# Patient Record
Sex: Male | Born: 1935 | Hispanic: Yes | Smoking: Former smoker
Health system: Southern US, Community
[De-identification: ages and names within clinical notes are randomized; demographics above are authoritative.]

---

## 2018-11-26 ENCOUNTER — Inpatient Hospital Stay (HOSPITAL_COMMUNITY)
Admission: EM | Admit: 2018-11-26 | Discharge: 2018-12-21 | DRG: 177 | Disposition: E | Payer: Medicaid Other | Attending: Internal Medicine | Admitting: Internal Medicine

## 2018-11-26 ENCOUNTER — Encounter (HOSPITAL_COMMUNITY): Payer: Self-pay

## 2018-11-26 ENCOUNTER — Emergency Department (HOSPITAL_COMMUNITY): Payer: Medicaid Other

## 2018-11-26 ENCOUNTER — Inpatient Hospital Stay (HOSPITAL_COMMUNITY): Payer: Medicaid Other

## 2018-11-26 ENCOUNTER — Other Ambulatory Visit: Payer: Self-pay

## 2018-11-26 DIAGNOSIS — I639 Cerebral infarction, unspecified: Secondary | ICD-10-CM | POA: Diagnosis not present

## 2018-11-26 DIAGNOSIS — G9341 Metabolic encephalopathy: Secondary | ICD-10-CM | POA: Diagnosis not present

## 2018-11-26 DIAGNOSIS — R740 Nonspecific elevation of levels of transaminase and lactic acid dehydrogenase [LDH]: Secondary | ICD-10-CM

## 2018-11-26 DIAGNOSIS — Z515 Encounter for palliative care: Secondary | ICD-10-CM | POA: Diagnosis not present

## 2018-11-26 DIAGNOSIS — R4182 Altered mental status, unspecified: Secondary | ICD-10-CM

## 2018-11-26 DIAGNOSIS — D696 Thrombocytopenia, unspecified: Secondary | ICD-10-CM | POA: Diagnosis present

## 2018-11-26 DIAGNOSIS — U071 COVID-19: Principal | ICD-10-CM | POA: Diagnosis present

## 2018-11-26 DIAGNOSIS — Z87891 Personal history of nicotine dependence: Secondary | ICD-10-CM | POA: Diagnosis not present

## 2018-11-26 DIAGNOSIS — I6381 Other cerebral infarction due to occlusion or stenosis of small artery: Secondary | ICD-10-CM | POA: Diagnosis not present

## 2018-11-26 DIAGNOSIS — R402142 Coma scale, eyes open, spontaneous, at arrival to emergency department: Secondary | ICD-10-CM | POA: Diagnosis not present

## 2018-11-26 DIAGNOSIS — J690 Pneumonitis due to inhalation of food and vomit: Secondary | ICD-10-CM | POA: Diagnosis not present

## 2018-11-26 DIAGNOSIS — R0602 Shortness of breath: Secondary | ICD-10-CM

## 2018-11-26 DIAGNOSIS — G9340 Encephalopathy, unspecified: Secondary | ICD-10-CM

## 2018-11-26 DIAGNOSIS — F0390 Unspecified dementia without behavioral disturbance: Secondary | ICD-10-CM | POA: Diagnosis present

## 2018-11-26 DIAGNOSIS — Z66 Do not resuscitate: Secondary | ICD-10-CM | POA: Diagnosis present

## 2018-11-26 DIAGNOSIS — J1282 Pneumonia due to coronavirus disease 2019: Secondary | ICD-10-CM | POA: Diagnosis present

## 2018-11-26 DIAGNOSIS — R402352 Coma scale, best motor response, localizes pain, at arrival to emergency department: Secondary | ICD-10-CM | POA: Diagnosis present

## 2018-11-26 DIAGNOSIS — G8191 Hemiplegia, unspecified affecting right dominant side: Secondary | ICD-10-CM | POA: Diagnosis not present

## 2018-11-26 DIAGNOSIS — R1310 Dysphagia, unspecified: Secondary | ICD-10-CM | POA: Diagnosis not present

## 2018-11-26 DIAGNOSIS — R402222 Coma scale, best verbal response, incomprehensible words, at arrival to emergency department: Secondary | ICD-10-CM | POA: Diagnosis present

## 2018-11-26 DIAGNOSIS — R7401 Elevation of levels of liver transaminase levels: Secondary | ICD-10-CM | POA: Diagnosis present

## 2018-11-26 DIAGNOSIS — R32 Unspecified urinary incontinence: Secondary | ICD-10-CM | POA: Diagnosis present

## 2018-11-26 DIAGNOSIS — Z79899 Other long term (current) drug therapy: Secondary | ICD-10-CM

## 2018-11-26 DIAGNOSIS — D509 Iron deficiency anemia, unspecified: Secondary | ICD-10-CM | POA: Diagnosis not present

## 2018-11-26 DIAGNOSIS — N179 Acute kidney failure, unspecified: Secondary | ICD-10-CM | POA: Diagnosis present

## 2018-11-26 DIAGNOSIS — R29728 NIHSS score 28: Secondary | ICD-10-CM | POA: Diagnosis present

## 2018-11-26 DIAGNOSIS — J9601 Acute respiratory failure with hypoxia: Secondary | ICD-10-CM | POA: Diagnosis not present

## 2018-11-26 DIAGNOSIS — I6330 Cerebral infarction due to thrombosis of unspecified cerebral artery: Secondary | ICD-10-CM

## 2018-11-26 LAB — CBC WITH DIFFERENTIAL/PLATELET
Abs Immature Granulocytes: 0.04 10*3/uL (ref 0.00–0.07)
Basophils Absolute: 0 10*3/uL (ref 0.0–0.1)
Basophils Relative: 0 %
Eosinophils Absolute: 0.2 10*3/uL (ref 0.0–0.5)
Eosinophils Relative: 2 %
HCT: 36.6 % — ABNORMAL LOW (ref 39.0–52.0)
Hemoglobin: 12 g/dL — ABNORMAL LOW (ref 13.0–17.0)
Immature Granulocytes: 1 %
Lymphocytes Relative: 11 %
Lymphs Abs: 0.9 10*3/uL (ref 0.7–4.0)
MCH: 25.5 pg — ABNORMAL LOW (ref 26.0–34.0)
MCHC: 32.8 g/dL (ref 30.0–36.0)
MCV: 77.9 fL — ABNORMAL LOW (ref 80.0–100.0)
Monocytes Absolute: 0.7 10*3/uL (ref 0.1–1.0)
Monocytes Relative: 9 %
Neutro Abs: 5.7 10*3/uL (ref 1.7–7.7)
Neutrophils Relative %: 77 %
Platelets: 142 10*3/uL — ABNORMAL LOW (ref 150–400)
RBC: 4.7 MIL/uL (ref 4.22–5.81)
RDW: 15.7 % — ABNORMAL HIGH (ref 11.5–15.5)
WBC: 7.5 10*3/uL (ref 4.0–10.5)
nRBC: 0 % (ref 0.0–0.2)

## 2018-11-26 LAB — URINALYSIS, ROUTINE W REFLEX MICROSCOPIC
Bacteria, UA: NONE SEEN
Bilirubin Urine: NEGATIVE
Glucose, UA: NEGATIVE mg/dL
Ketones, ur: NEGATIVE mg/dL
Leukocytes,Ua: NEGATIVE
Nitrite: NEGATIVE
Protein, ur: 30 mg/dL — AB
Specific Gravity, Urine: 1.01 (ref 1.005–1.030)
pH: 6 (ref 5.0–8.0)

## 2018-11-26 LAB — D-DIMER, QUANTITATIVE: D-Dimer, Quant: 1.29 ug/mL-FEU — ABNORMAL HIGH (ref 0.00–0.50)

## 2018-11-26 LAB — LACTIC ACID, PLASMA: Lactic Acid, Venous: 1.3 mmol/L (ref 0.5–1.9)

## 2018-11-26 LAB — COMPREHENSIVE METABOLIC PANEL
ALT: 20 U/L (ref 0–44)
AST: 43 U/L — ABNORMAL HIGH (ref 15–41)
Albumin: 2.9 g/dL — ABNORMAL LOW (ref 3.5–5.0)
Alkaline Phosphatase: 67 U/L (ref 38–126)
Anion gap: 7 (ref 5–15)
BUN: 10 mg/dL (ref 8–23)
CO2: 23 mmol/L (ref 22–32)
Calcium: 8 mg/dL — ABNORMAL LOW (ref 8.9–10.3)
Chloride: 106 mmol/L (ref 98–111)
Creatinine, Ser: 1.22 mg/dL (ref 0.61–1.24)
GFR calc Af Amer: 57 mL/min — ABNORMAL LOW (ref >60)
GFR calc non Af Amer: 49 mL/min — ABNORMAL LOW (ref >60)
Glucose, Bld: 99 mg/dL (ref 70–99)
Potassium: 4.2 mmol/L (ref 3.5–5.1)
Sodium: 136 mmol/L (ref 135–145)
Total Bilirubin: 0.8 mg/dL (ref 0.3–1.2)
Total Protein: 6.1 g/dL — ABNORMAL LOW (ref 6.5–8.1)

## 2018-11-26 LAB — FERRITIN: Ferritin: 122 ng/mL (ref 24–336)

## 2018-11-26 LAB — ABO/RH: ABO/RH(D): O POS

## 2018-11-26 LAB — TROPONIN I: Troponin I: 0.03 ng/mL (ref <0.03)

## 2018-11-26 LAB — PROTIME-INR
INR: 1 (ref 0.8–1.2)
Prothrombin Time: 13.2 seconds (ref 11.4–15.2)

## 2018-11-26 LAB — BRAIN NATRIURETIC PEPTIDE: B Natriuretic Peptide: 178.3 pg/mL — ABNORMAL HIGH (ref 0.0–100.0)

## 2018-11-26 LAB — SARS CORONAVIRUS 2 BY RT PCR (HOSPITAL ORDER, PERFORMED IN ~~LOC~~ HOSPITAL LAB): SARS Coronavirus 2: POSITIVE — AB

## 2018-11-26 LAB — LACTATE DEHYDROGENASE: LDH: 207 U/L — ABNORMAL HIGH (ref 98–192)

## 2018-11-26 LAB — PROCALCITONIN: Procalcitonin: <0.1 ng/mL

## 2018-11-26 LAB — C-REACTIVE PROTEIN: CRP: 11.4 mg/dL — ABNORMAL HIGH (ref <1.0)

## 2018-11-26 MED ORDER — VITAMIN C 500 MG PO TABS
500.0000 mg | ORAL_TABLET | Freq: Every day | ORAL | Status: DC
  Filled 2018-11-26: qty 1

## 2018-11-26 MED ORDER — GUAIFENESIN-DM 100-10 MG/5ML PO SYRP: 10.0000 mL | ORAL_SOLUTION | ORAL | Status: DC | PRN

## 2018-11-26 MED ORDER — ACETAMINOPHEN 650 MG RE SUPP
650.0000 mg | Freq: Once | RECTAL | Status: AC
  Administered 2018-11-26: 650 mg via RECTAL
  Filled 2018-11-26: qty 1

## 2018-11-26 MED ORDER — ONDANSETRON HCL 4 MG PO TABS: 4.0000 mg | ORAL_TABLET | Freq: Four times a day (QID) | ORAL | Status: DC | PRN

## 2018-11-26 MED ORDER — ENOXAPARIN SODIUM 40 MG/0.4ML ~~LOC~~ SOLN
40.0000 mg | Freq: Every day | SUBCUTANEOUS | Status: DC
  Administered 2018-11-27 – 2018-11-29 (×3): 40 mg via SUBCUTANEOUS
  Filled 2018-11-26 (×3): qty 0.4

## 2018-11-26 MED ORDER — METHYLPREDNISOLONE SODIUM SUCC 125 MG IJ SOLR
60.0000 mg | Freq: Two times a day (BID) | INTRAMUSCULAR | Status: DC
  Administered 2018-11-26: 60 mg via INTRAVENOUS
  Filled 2018-11-26: qty 2

## 2018-11-26 MED ORDER — ONDANSETRON HCL 4 MG/2ML IJ SOLN: 4.0000 mg | Freq: Four times a day (QID) | INTRAMUSCULAR | Status: DC | PRN

## 2018-11-26 MED ORDER — SODIUM CHLORIDE 0.9% FLUSH
3.0000 mL | Freq: Two times a day (BID) | INTRAVENOUS | Status: DC
  Administered 2018-11-27 – 2018-11-29 (×4): 3 mL via INTRAVENOUS

## 2018-11-26 MED ORDER — ALBUTEROL SULFATE HFA 108 (90 BASE) MCG/ACT IN AERS
2.0000 | INHALATION_SPRAY | Freq: Four times a day (QID) | RESPIRATORY_TRACT | Status: DC
  Administered 2018-11-26: 2 via RESPIRATORY_TRACT
  Filled 2018-11-26: qty 6.7

## 2018-11-26 MED ORDER — SODIUM CHLORIDE 0.9 % IV SOLN
INTRAVENOUS | Status: DC
  Administered 2018-11-27 – 2018-11-29 (×4): via INTRAVENOUS

## 2018-11-26 MED ORDER — SODIUM CHLORIDE 0.9 % IV BOLUS: 500.0000 mL | Freq: Once | INTRAVENOUS | Status: DC

## 2018-11-26 MED ORDER — HYDROCOD POLST-CPM POLST ER 10-8 MG/5ML PO SUER: 5.0000 mL | Freq: Two times a day (BID) | ORAL | Status: DC | PRN

## 2018-11-26 MED ORDER — ZINC SULFATE 220 (50 ZN) MG PO CAPS
220.0000 mg | ORAL_CAPSULE | Freq: Every day | ORAL | Status: DC
  Filled 2018-11-26: qty 1

## 2018-11-26 NOTE — ED Notes (Signed)
Pt is in bed, resting, snoring. VSS, Pt on 3L [place], spo2 at 100%. Pt still very sluggish to respond.

## 2018-11-26 NOTE — ED Triage Notes (Signed)
Pt from home with complaint of unresponsive, altered mental status per family. LSN 2200 last night. Family states that pt was normal yesterday but unknown what baseline looks like. Pt lives in a storage like building around the back side of a dwelling where there isn't much room. It appeared as though the pt is normally ambulatory. Pt alert to painful stimuli, airway intact. VSS. Placed on NRB, changed to 3L [place] here. Pt is spanish speaking. Family that lives with pt stated that they have all been coughing and having shob.

## 2018-11-26 NOTE — ED Notes (Signed)
Pts son Cheri Rous contacted, updated on status and plan of care

## 2018-11-26 NOTE — ED Notes (Signed)
ED TO INPATIENT HANDOFF REPORT  ED Nurse Name and Phone #: Lorin PicketScott, [phone]  S Name/Age/Gender Jimmy Burns 85 y.o. male Room/Bed: RESUSC/RESUSC  Code Status   Code Status: Not on file  Home/SNF/Other Home   Triage Complete: Triage complete  Chief Complaint Altered; Unresponsive  Triage Note Pt from home with complaint of unresponsive, altered mental status per family. LSN 2200 last night. Family states that pt was normal yesterday but unknown what baseline looks like. Pt lives in a storage like building around the back side of a dwelling where there isn't much room. It appeared as though the pt is normally ambulatory. Pt alert to painful stimuli, airway intact. VSS. Placed on NRB, changed to 3L [place] here. Pt is spanish speaking. Family that lives with pt stated that they have all been coughing and having shob.    Allergies No Known Allergies  Level of Care/Admitting Diagnosis ED Disposition    ED Disposition Condition Comment   Admit  H[facility] [[id number]]  Level of Care: Telemetry [5]  Covid Evaluation: Confirmed COVID Positive  Isolation Risk Level: High Risk/Airborne (Aerosolizing procedure, nebulizer, intubated/ventilation, CPAP/BiPAP)  Diagnosis: Pneumonia due to COVID-19 virus [[id number]][[phone]]  Admitting Physician: Clydie BraunSMITH, RONDELL A [[id number]]  Attending Physician: Clydie BraunSMITH, RONDELL A [[id number]]  Estimated length of stay: past midnight tomorrow  Certification:: I certify this patient will need inpatient services for at least 2 midnights  PT Class (Do Not Modify): Inpatient [101]  PT Acc Code (Do Not Modify): Private [1]       B Medical/Surgery History History reviewed. No pertinent past medical history. History reviewed. No pertinent surgical history.   A IV Location/Drains/Wounds Patient Lines/Drains/Airways Status   Active Line/Drains/Airways    Name:   Placement date:   Placement time:   Site:   Days:   Peripheral IV 09-May-2019 Right  Antecubital   09-May-2019    -    Antecubital   less than 1          Intake/Output Last 24 hours No intake or output data in the 24 hours ending 09-May-2019 1616  Labs/Imaging Results for orders placed or performed during the hospital encounter of 09-May-2019 (from the past 48 hour(s))  SARS Coronavirus 2 (CEPHEID - Performed in [facility] hospital lab), Hosp Order     Status: Abnormal   Collection Time: 09-May-2019  1:14 PM  Result Value Ref Range   SARS Coronavirus 2 POSITIVE (A) NEGATIVE    Comment: CRITICAL RESULT CALLED TO, READ BACK BY AND VERIFIED WITH: RN KEVIN B. 1449 [id number] FCP (NOTE) If result is NEGATIVE SARS-CoV-2 target nucleic acids are NOT DETECTED. The SARS-CoV-2 RNA is generally detectable in upper and lower  respiratory specimens during the acute phase of infection. The lowest  concentration of SARS-CoV-2 viral copies this assay can detect is 250  copies / mL. A negative result does not preclude SARS-CoV-2 infection  and should not be used as the sole basis for treatment or other  patient management decisions.  A negative result may occur with  improper specimen collection / handling, submission of specimen other  than nasopharyngeal swab, presence of viral mutation(s) within the  areas targeted by this assay, and inadequate number of viral copies  (<250 copies / mL). A negative result must be combined with clinical  observations, patient history, and epidemiological information. If result is POSITIVE SARS-CoV-2 target nucleic acids are DETECTED.  The SARS-CoV-2 RNA is generally detectable in upper and lower  respiratory specimens  during the acute phase of infection.  Positive  results are indicative of active infection with SARS-CoV-2.  Clinical  correlation with patient history and other diagnostic information is  necessary to determine patient infection status.  Positive results do  not rule out bacterial infection or co-infection with other viruses. If result is  PRESUMPTIVE POSTIVE SARS-CoV-2 nucleic acids MAY BE PRESENT.   A presumptive positive result was obtained on the submitted specimen  and confirmed on repeat testing.  While 2019 novel coronavirus  (SARS-CoV-2) nucleic acids may be present in the submitted sample  additional confirmatory testing may be necessary for epidemiological  and / or clinical management purposes  to differentiate between  SARS-CoV-2 and other Sarbecovirus currently known to infect humans.  If clinically indicated additional testing with an alternate test  methodology [phone] i s advised. The SARS-CoV-2 RNA is generally  detectable in upper and lower respiratory specimens during the acute  phase of infection. The expected result is Negative. Fact Sheet for Patients:  BoilerBrush.com.cyhttps://www.fda.gov/media/136312/download Fact Sheet for Healthcare Providers: https://pope.com/https://www.fda.gov/media/136313/download This test is not yet approved or cleared by the [place] FDA and has been authorized for detection and/or diagnosis of SARS-CoV-2 by FDA under an Emergency Use Authorization (EUA).  This EUA will remain in effect (meaning this test can be used) for the duration of the COVID-19 declaration under Section 564(b)(1) of the Act, 21 U.S.C. section 360bbb-3(b)(1), unless the authorization is terminated or revoked sooner. Performed at [facility], [address] [place], [place] [address]   Comprehensive metabolic panel     Status: Abnormal   Collection Time: October 20, 2018  1:22 PM  Result Value Ref Range   Sodium 136 135 - 145 mmol/L   Potassium 4.2 3.5 - 5.1 mmol/L   Chloride 106 98 - 111 mmol/L   CO2 23 22 - 32 mmol/L   Glucose, Bld 99 70 - 99 mg/dL   BUN 10 8 - 23 mg/dL   Creatinine, Ser 4.401.22 0.61 - 1.24 mg/dL   Calcium 8.0 (L) 8.9 - 10.3 mg/dL   Total Protein 6.1 (L) 6.5 - 8.1 g/dL   Albumin 2.9 (L) 3.5 - 5.0 g/dL   AST 43 (H) 15 - 41 U/L   ALT 20 0 - 44 U/L   Alkaline Phosphatase 67 38 - 126 U/L   Total  Bilirubin 0.8 0.3 - 1.2 mg/dL   GFR calc non Af Amer 49 (L) >60 mL/min   GFR calc Af Amer 57 (L) >60 mL/min   Anion gap 7 5 - 15    Comment: Performed at [facility], [address] [place], [place] [address]  Lactic acid, plasma     Status: None   Collection Time: October 20, 2018  1:22 PM  Result Value Ref Range   Lactic Acid, Venous 1.3 0.5 - 1.9 mmol/L    Comment: Performed at [facility], [address] [place], [place] [address]  CBC with Differential     Status: Abnormal   Collection Time: October 20, 2018  1:22 PM  Result Value Ref Range   WBC 7.5 4.0 - 10.5 K/uL   RBC 4.70 4.22 - 5.81 MIL/uL   Hemoglobin 12.0 (L) 13.0 - 17.0 g/dL   HCT 40.336.6 (L) 47.439.0 - 25.952.0 %   MCV 77.9 (L) 80.0 - 100.0 fL   MCH 25.5 (L) 26.0 - 34.0 pg   MCHC 32.8 30.0 - 36.0 g/dL   RDW 56.315.7 (H) 87.511.5 - 64.315.5 %   Platelets 142 (  L) 150 - 400 K/uL   nRBC 0.0 0.0 - 0.2 %   Neutrophils Relative % 77 %   Neutro Abs 5.7 1.7 - 7.7 K/uL   Lymphocytes Relative 11 %   Lymphs Abs 0.9 0.7 - 4.0 K/uL   Monocytes Relative 9 %   Monocytes Absolute 0.7 0.1 - 1.0 K/uL   Eosinophils Relative 2 %   Eosinophils Absolute 0.2 0.0 - 0.5 K/uL   Basophils Relative 0 %   Basophils Absolute 0.0 0.0 - 0.1 K/uL   Immature Granulocytes 1 %   Abs Immature Granulocytes 0.04 0.00 - 0.07 K/uL    Comment: Performed at [facility], [address] [place], [place] [address]  Protime-INR     Status: None   Collection Time: 12/17/2018  1:22 PM  Result Value Ref Range   Prothrombin Time 13.2 11.4 - 15.2 seconds   INR 1.0 0.8 - 1.2    Comment: (NOTE) INR goal varies based on device and disease states. Performed at [facility], [address] [place], [place] [address]   Urinalysis, Routine w reflex microscopic     Status: Abnormal   Collection Time: 12/17/2018  1:26 PM  Result Value Ref Range   Color, Urine YELLOW YELLOW   APPearance CLEAR CLEAR   Specific Gravity, Urine 1.010 1.005 - 1.030   pH 6.0 5.0 - 8.0   Glucose,  UA NEGATIVE NEGATIVE mg/dL   Hgb urine dipstick MODERATE (A) NEGATIVE   Bilirubin Urine NEGATIVE NEGATIVE   Ketones, ur NEGATIVE NEGATIVE mg/dL   Protein, ur 30 (A) NEGATIVE mg/dL   Nitrite NEGATIVE NEGATIVE   Leukocytes,Ua NEGATIVE NEGATIVE   WBC, UA 0-5 0 - 5 WBC/hpf   Bacteria, UA NONE SEEN NONE SEEN   Mucus PRESENT     Comment: Performed at [facility], [address] [place], [place] [address]   Ct Head Wo Contrast  Result Date: 12/07/2018 CLINICAL DATA:  Altered mental status. EXAM: CT HEAD WITHOUT CONTRAST TECHNIQUE: Contiguous axial images were obtained from the base of the skull through the vertex without intravenous contrast. COMPARISON:  None. FINDINGS: Brain: Expected cerebral and cerebellar volume loss for age. Moderate low density in the periventricular white matter likely related to small vessel disease. No mass lesion, hemorrhage, hydrocephalus, acute infarct, intra-axial, or extra-axial fluid collection. Vascular: Intracranial atherosclerosis. Skull: Normal Sinuses/Orbits: Surgical changes about both globes. Left maxillary sinus mucous retention cyst or polyp. Clear mastoid air cells. Other: None. IMPRESSION: Normal head CT for age. No explanation for altered mental status. Cerebral atrophy and small vessel ischemic change. Electronically Signed   By: Jeronimo GreavesKyle  Talbot M.D.   On: 11/27/2018 15:52   Dg Chest Portable 1 View  Result Date: 11/21/2018 CLINICAL DATA:  Unresponsive.  Altered mental status. EXAM: PORTABLE CHEST 1 VIEW COMPARISON:  None. FINDINGS: Lungs are adequately inflated without focal airspace consolidation or effusion. No pneumothorax. Cardiomediastinal silhouette is within normal. Mild degenerate change of the spine. IMPRESSION: No active disease. Electronically Signed   By: Elberta Fortisaniel  Boyle M.D.   On: 12/05/2018 15:15    Pending Labs Unresulted Labs (From admission, onward)    Start     Ordered   12/01/2018 1548  Sedimentation rate  Once,   R     12/16/2018 1547    12/09/2018 1548  Triglycerides  Once,   R     11/27/2018 1547   12/10/2018 1548  Troponin I - Once  Once,   R  2018-12-17 1547   Dec 17, 2018 1548  D-dimer, quantitative (not at [facility])  Once,   R     12/17/2018 1547   2018-12-17 1547  ABO/Rh  Once,   R     17-Dec-2018 1547   12/17/18 1547  Brain natriuretic peptide  Once,   R     12-17-2018 1547   2018/12/17 1547  C-reactive protein  Once,   R     12-17-2018 1547   12/17/2018 1547  Ferritin  Once,   R     12-17-18 1547   12-17-2018 1547  Fibrinogen  Once,   R     12/17/18 1547   2018-12-17 1547  Glucose 6 phosphate dehydrogenase  Once,   R     Dec 17, 2018 1547   2018/12/17 1547  Hepatitis B surface antigen  Once,   R     17-Dec-2018 1547   December 17, 2018 1547  Interleukin-6, Plasma  Once,   R     12/17/18 1547   12-17-18 1547  Lactate dehydrogenase  Once,   R     12-17-18 1547   2018/12/17 1547  Procalcitonin  Once,   R     12-17-18 1547   17-Dec-2018 1319  Urine culture  ONCE - STAT,   STAT     Dec 17, 2018 1318   12-17-2018 1311  Culture, blood (Routine x 2)  BLOOD CULTURE X 2,   STAT     2018/12/17 1311          Vitals/Pain Today's Vitals   2018-12-17 1500 12/17/2018 1515 12/17/2018 1530 12-17-2018 1545  BP: (!) 143/50 (!) 118/59 (!) 127/53 (!) 127/51  Pulse: (!) 58  64 (!) 59  Resp: 19  19 20   Temp:   97.7 F (36.5 C)   TempSrc:   Rectal   SpO2: 99%  100% 100%  Weight:      Height:      PainSc:        Isolation Precautions Airborne and Contact precautions  Medications Medications  sodium chloride flush (NS) 0.9 % injection 3 mL (has no administration in time range)  0.9 %  sodium chloride infusion (has no administration in time range)  acetaminophen (TYLENOL) suppository 650 mg (650 mg Rectal Given 12/17/2018 1413)    Mobility walks High fall risk   Focused Assessments    R Recommendations: See Admitting Provider Note  Report given to:   Additional Notes:

## 2018-11-26 NOTE — ED Notes (Signed)
Per MRI pt has one patient other than one on the exam table before he can be done.  MRI will need staff to accompany d/t COVID+

## 2018-11-26 NOTE — ED Notes (Signed)
[facility] contacted regarding results of MRI, pt will now stay at [facility] for neuro evaluation

## 2018-11-26 NOTE — ED Notes (Signed)
Pt to MRI utilizing full precautions with EDT

## 2018-11-26 NOTE — ED Notes (Signed)
Called pt's granddaughter and updated her on her grandfather and where he would be going. Granddaughter had no questions and was thankful for all the updates by staff.

## 2018-11-26 NOTE — ED Notes (Addendum)
Received a message from Dr. Delbert Phenix. Tamala Julian that this pt needed an MRI before going over to [facility] to rule out a stroke. Immediately contacted MRI and they stated there were 5 pt's ahead of this patient with no way of moving this pt ahead of the line. [facility] called and transport cancelled at this time.

## 2018-11-26 NOTE — ED Notes (Signed)
Pt's family Rozanna Boer daughter Trevor Mace [phone] updated on status and plan of care.

## 2018-11-26 NOTE — H&P (Signed)
History and Physical    Reda [place] BTD:[id number] DOB: 04/10/1918 DOA: 11/29/2018  Referring MD/NP/PA: Alvira MondayErin Schlossman, MD PCP: Patient, No Pcp Per  Patient coming from: Home  Chief Complaint: Altered mental status  I have personally briefly reviewed patient's old medical records in [facility]   HPI: Jimmy Burns is a 85 y.o. Spanish-speaking male with past medical history significant for remote tobacco abuse; who presents after being found acutely altered this morning.  Patient is currently altered and granddaughter gives history.  He was last noted to be normal around 9p dinnertime.  This morning he didn't wake up like normal.  He states that he will was not talking or responding like normal.  His wife also stated that he peed on himself overnight which was unusual.  Granddaughter notes that she had a cough last week, but had tested negative for COVID-19 at that time. Does not have a primary physician.  Family reports remote history of tobacco abuse quit 20 years.   ED Course: Upon admission into the emergency department patient was noted to 101.7 F, pulse 58-76, respirations 15-21, blood pressures maintained, and O2 saturations 90-100% on 3 L nasal cannula oxygen. Urinalysis and chest x-ray negative for any signs of infection.  CT scan of brain did not show any acute abnormalities.  Initial inflammatory markers pending. Discussed prognosis with wife and granddaughter over the phone with the virus.  They were agreeable to DNR code status.  Review of Systems  Unable to perform ROS: Mental status change    History reviewed. No pertinent past medical history.  History reviewed. No pertinent surgical history.   reports that he has quit smoking. He has never used smokeless tobacco. He reports that he does not drink alcohol or use drugs.  No Known Allergies  History reviewed. No pertinent family history.  Prior to Admission medications   Not on File    Physical Exam:   Constitutional: Elderly male who appears very lethargic Vitals:   May 31, 2019 1430 May 31, 2019 1445 May 31, 2019 1500 May 31, 2019 1515  BP: 139/70 132/65 (!) 143/50 (!) 118/59  Pulse: 61 60 (!) 58   Resp: 17 19 19    Temp:      TempSrc:      SpO2: 100% 100% 99%   Weight:      Height:       Eyes: PERRL, lids and conjunctivae normal ENMT: Mucous membranes are dry. Posterior pharynx clear of any exudate or lesions.   Neck: normal, supple, no masses, no thyromegaly Respiratory: Decreased overall aeration with no significant wheezes or rhonchi appreciated.  Patient on 3 L nasal cannula oxygen maintaining O2 saturations at this time. Cardiovascular: Regular rate and rhythm, no murmurs / rubs / gallops. No extremity edema. 2+ pedal pulses. No carotid bruits.  Abdomen: no tenderness, no masses palpated. No hepatosplenomegaly. Bowel sounds positive.  Musculoskeletal: no clubbing / cyanosis. No joint deformity upper and lower extremities. Good ROM, no contractures. Normal muscle tone.  Skin: no rashes, lesions, ulcers. No induration Neurologic: CN 2-12 grossly intact.  Will move to noxious stimuli. Psychiatric: Lethargic, cannot assess for orientation at this time.   Labs on Admission: I have personally reviewed following labs and imaging studies  CBC: Recent Labs  Lab May 31, 2019 1322  WBC 7.5  NEUTROABS 5.7  HGB 12.0*  HCT 36.6*  MCV 77.9*  PLT 142*   Basic Metabolic Panel: Recent Labs  Lab May 31, 2019 1322  NA 136  K 4.2  CL 106  CO2 23  GLUCOSE  99  BUN 10  CREATININE 1.22  CALCIUM 8.0*   GFR: Estimated Creatinine Clearance: 30.4 mL/min (by C-G formula based on SCr of 1.22 mg/dL). Liver Function Tests: Recent Labs  Lab 12/14/2018 1322  AST 43*  ALT 20  ALKPHOS 67  BILITOT 0.8  PROT 6.1*  ALBUMIN 2.9*   No results for input(s): LIPASE, AMYLASE in the last 168 hours. No results for input(s): AMMONIA in the last 168 hours. Coagulation Profile: Recent Labs  Lab 11/23/2018 1322  INR  1.0   Cardiac Enzymes: No results for input(s): CKTOTAL, CKMB, CKMBINDEX, TROPONINI in the last 168 hours. BNP (last 3 results) No results for input(s): PROBNP in the last 8760 hours. HbA1C: No results for input(s): HGBA1C in the last 72 hours. CBG: No results for input(s): GLUCAP in the last 168 hours. Lipid Profile: No results for input(s): CHOL, HDL, LDLCALC, TRIG, CHOLHDL, LDLDIRECT in the last 72 hours. Thyroid Function Tests: No results for input(s): TSH, T4TOTAL, FREET4, T3FREE, THYROIDAB in the last 72 hours. Anemia Panel: No results for input(s): VITAMINB12, FOLATE, FERRITIN, TIBC, IRON, RETICCTPCT in the last 72 hours. Urine analysis:    Component Value Date/Time   COLORURINE YELLOW 12/07/2018 1326   APPEARANCEUR CLEAR 12/20/2018 1326   LABSPEC 1.010 12/10/2018 1326   PHURINE 6.0 11/25/2018 1326   GLUCOSEU NEGATIVE 12/11/2018 1326   HGBUR MODERATE (A) 12/17/2018 1326   BILIRUBINUR NEGATIVE 11/29/2018 1326   KETONESUR NEGATIVE 12/19/2018 1326   PROTEINUR 30 (A) 11/21/2018 1326   NITRITE NEGATIVE 11/27/2018 1326   LEUKOCYTESUR NEGATIVE 12/16/2018 1326   Sepsis Labs: Recent Results (from the past 240 hour(s))  SARS Coronavirus 2 (CEPHEID - Performed in [facility] lab), Hosp Order     Status: Abnormal   Collection Time: 12/13/2018  1:14 PM  Result Value Ref Range Status   SARS Coronavirus 2 POSITIVE (A) NEGATIVE Final    Comment: CRITICAL RESULT CALLED TO, READ BACK BY AND VERIFIED WITH: RN KEVIN B. 1449 [id number] FCP (NOTE) If result is NEGATIVE SARS-CoV-2 target nucleic acids are NOT DETECTED. The SARS-CoV-2 RNA is generally detectable in upper and lower  respiratory specimens during the acute phase of infection. The lowest  concentration of SARS-CoV-2 viral copies this assay can detect is 250  copies / mL. A negative result does not preclude SARS-CoV-2 infection  and should not be used as the sole basis for treatment or other  patient management decisions.   A negative result may occur with  improper specimen collection / handling, submission of specimen other  than nasopharyngeal swab, presence of viral mutation(s) within the  areas targeted by this assay, and inadequate number of viral copies  (<250 copies / mL). A negative result must be combined with clinical  observations, patient history, and epidemiological information. If result is POSITIVE SARS-CoV-2 target nucleic acids are DETECTED.  The SARS-CoV-2 RNA is generally detectable in upper and lower  respiratory specimens during the acute phase of infection.  Positive  results are indicative of active infection with SARS-CoV-2.  Clinical  correlation with patient history and other diagnostic information is  necessary to determine patient infection status.  Positive results do  not rule out bacterial infection or co-infection with other viruses. If result is PRESUMPTIVE POSTIVE SARS-CoV-2 nucleic acids MAY BE PRESENT.   A presumptive positive result was obtained on the submitted specimen  and confirmed on repeat testing.  While 2019 novel coronavirus  (SARS-CoV-2) nucleic acids may be present in the submitted sample  additional  confirmatory testing may be necessary for epidemiological  and / or clinical management purposes  to differentiate between  SARS-CoV-2 and other Sarbecovirus currently known to infect humans.  If clinically indicated additional testing with an alternate test  methodology [id number]) i s advised. The SARS-CoV-2 RNA is generally  detectable in upper and lower respiratory specimens during the acute  phase of infection. The expected result is Negative. Fact Sheet for Patients:  BoilerBrush.com.cyhttps://www.fda.gov/media/136312/download Fact Sheet for Healthcare Providers: https://pope.com/https://www.fda.gov/media/136313/download This test is not yet approved or cleared by the [place] FDA and has been authorized for detection and/or diagnosis of SARS-CoV-2 by FDA under an Emergency Use  Authorization (EUA).  This EUA will remain in effect (meaning this test can be used) for the duration of the COVID-19 declaration under Section 564(b)(1) of the Act, 21 U.S.C. section 360bbb-3(b)(1), unless the authorization is terminated or revoked sooner. Performed at [facility], [address] [place], [place] [address]      Radiological Exams on Admission: Ct Head Wo Contrast  Result Date: 12/01/2018 CLINICAL DATA:  Altered mental status. EXAM: CT HEAD WITHOUT CONTRAST TECHNIQUE: Contiguous axial images were obtained from the base of the skull through the vertex without intravenous contrast. COMPARISON:  None. FINDINGS: Brain: Expected cerebral and cerebellar volume loss for age. Moderate low density in the periventricular white matter likely related to small vessel disease. No mass lesion, hemorrhage, hydrocephalus, acute infarct, intra-axial, or extra-axial fluid collection. Vascular: Intracranial atherosclerosis. Skull: Normal Sinuses/Orbits: Surgical changes about both globes. Left maxillary sinus mucous retention cyst or polyp. Clear mastoid air cells. Other: None. IMPRESSION: Normal head CT for age. No explanation for altered mental status. Cerebral atrophy and small vessel ischemic change. Electronically Signed   By: Jeronimo GreavesKyle  Talbot M.D.   On: 12/06/2018 15:52   Dg Chest Portable 1 View  Result Date: 12/14/2018 CLINICAL DATA:  Unresponsive.  Altered mental status. EXAM: PORTABLE CHEST 1 VIEW COMPARISON:  None. FINDINGS: Lungs are adequately inflated without focal airspace consolidation or effusion. No pneumothorax. Cardiomediastinal silhouette is within normal. Mild degenerate change of the spine. IMPRESSION: No active disease. Electronically Signed   By: Elberta Fortisaniel  Boyle M.D.   On: 12/10/2018 15:15    EKG: Independently reviewed.  Sinus rhythm at 80 bpm with PVCs  Assessment/Plan COVID-19 virus infection: Acute.  Patient presents acutely altered with fever up to 100.7 F, and  found to be positive for COVID-19.  Chest x-ray negative for any acute abnormalities.  Inflammatory markers pending. -[facility] if MRI negative -COVID-19 order set initiated -Albuterol inhaler every 6 hours -Normal saline IV fluids at 75 mL/h -Continue to monitor inflammatory markers daily   Acute encephalopathy suspected due to COVID-19: Patient not responding like normal previously have been in his normal state of health last night reportedly.  No focal deficits noted on physical exam.  CT scan of the brain negative for any acute abnormalities. -Check stat MRI of the brain without contrast  -Will consult neurology and remain [facility] if positive for signs of a stroke  Microcytic microchromic anemia patient's hemoglobin 12 with low MCV and MCH.  -Follow-up repeat CBC in a.m.  Thrombocytopenia: Acute.  Platelet count mildly low at 142 on admission.  No previous history to compare. -Continue to monitor  Elevated AST: Acute.  AST elevated to 43.  Granddaughter states patient no significant history of alcohol use -Continue to monitor  DVT prophylaxis: Lovenox Code Status: DNR Family Communication: Discussed plan of care  with granddaughter and wife over the phone Disposition Plan: To be determined Consults called: None Admission status: Inpatient  Norval Morton MD [facility] Pager [phone]   If 7PM-7AM, please contact night-coverage [email or web address] Password [email or web address]  2018/11/27, 4:00 PM

## 2018-11-26 NOTE — ED Provider Notes (Signed)
[facility] EMERGENCY DEPARTMENT Provider Note   CSN: [id number] Arrival date & time: 12/20/2018  1300    History   Chief Complaint Chief Complaint  Patient presents with   Altered Mental Status    HPI Jimmy Burns is a 85 y.o. male.     HPI   85 year old Spanish speaking male with no known medical history presents with altered mental status, generalized weakness.  Family members have been sick with cough, fever.  Last known normal was 10PM last night.  Yesterday was eating, seemed ok. This morning he was laying in bed, confused, fatigue. Wouldn't talk with family members.  Nausea, dry heaves last night.  Did not answer if he was in pain this AM.   History limited by patient with altered mental status, language barrier.  Sister-in-law of granddaughter, sick with cough, not sure results of test Granddaugher had COVID19 test which was negative, cold symptoms Don't live in same home but see each other regular  Hx of htn  History reviewed. No pertinent past medical history.  Patient Active Problem List   Diagnosis Date Noted   COVID-19 virus infection 05-12-19   Acute encephalopathy 05-12-19   Microcytic hypochromic anemia 05-12-19   Elevated AST (SGOT) 05-12-19   Thrombocytopenia (HCC) 05-12-19    History reviewed. No pertinent surgical history.      Home Medications    Prior to Admission medications   Medication Sig Start Date End Date Taking? Authorizing Provider  acetaminophen (TYLENOL) 500 MG tablet Take 500-1,000 mg by mouth every 6 (six) hours as needed (for pain).   Yes [provider]    Family History History reviewed. No pertinent family history.  Social History Social History   Tobacco Use   Smoking status: Former Smoker   Smokeless tobacco: Never Used  Substance Use Topics   Alcohol use: Never    Frequency: Never   Drug use: Never     Allergies   Patient has no known allergies.   Review  of Systems Review of Systems  Unable to perform ROS: Mental status change  Constitutional: Positive for activity change, fatigue and fever.  Gastrointestinal: Positive for nausea.     Physical Exam Updated Vital Signs BP 121/64    Pulse 61    Temp 97.7 F (36.5 C) (Rectal)    Resp (!) 22    Ht 5\' 10"  (1.778 m)    Wt 63.5 kg    SpO2 97%    BMI 20.09 kg/m   Physical Exam Vitals signs and nursing note reviewed.  Constitutional:      Appearance: He is well-developed. He is ill-appearing and toxic-appearing. He is not diaphoretic.  HENT:     Head: Normocephalic and atraumatic.  Eyes:     Conjunctiva/sclera: Conjunctivae normal.  Neck:     Musculoskeletal: Normal range of motion.  Cardiovascular:     Rate and Rhythm: Normal rate and regular rhythm.  Pulmonary:     Effort: Pulmonary effort is normal. No respiratory distress.     Breath sounds: No wheezing.  Abdominal:     General: There is no distension.     Tenderness: There is no abdominal tenderness.  Skin:    General: Skin is warm and dry.  Neurological:     Mental Status: He is oriented to person, place, and time. He is lethargic.     GCS: GCS eye subscore is 4. GCS verbal subscore is 2. GCS motor subscore is 5.  Cranial Nerves: No dysarthria.     Comments: Generalized weakness, no focal deficits      ED Treatments / Results  Labs (all labs ordered are listed, but only abnormal results are displayed) Labs Reviewed  SARS CORONAVIRUS 2 (HOSPITAL ORDER, [place] [facility]) - Abnormal; Notable for the following components:      Result Value   SARS Coronavirus 2 POSITIVE (*)    All other components within normal limits  COMPREHENSIVE METABOLIC PANEL - Abnormal; Notable for the following components:   Calcium 8.0 (*)    Total Protein 6.1 (*)    Albumin 2.9 (*)    AST 43 (*)    GFR calc non Af Amer 49 (*)    GFR calc Af Amer 57 (*)    All other components within normal limits  CBC WITH  DIFFERENTIAL/PLATELET - Abnormal; Notable for the following components:   Hemoglobin 12.0 (*)    HCT 36.6 (*)    MCV 77.9 (*)    MCH 25.5 (*)    RDW 15.7 (*)    Platelets 142 (*)    All other components within normal limits  URINALYSIS, ROUTINE W REFLEX MICROSCOPIC - Abnormal; Notable for the following components:   Hgb urine dipstick MODERATE (*)    Protein, ur 30 (*)    All other components within normal limits  C-REACTIVE PROTEIN - Abnormal; Notable for the following components:   CRP 11.4 (*)    All other components within normal limits  D-DIMER, QUANTITATIVE (NOT AT [facility]) - Abnormal; Notable for the following components:   D-Dimer, Quant 1.29 (*)    All other components within normal limits  BRAIN NATRIURETIC PEPTIDE - Abnormal; Notable for the following components:   B Natriuretic Peptide 178.3 (*)    All other components within normal limits  LACTATE DEHYDROGENASE - Abnormal; Notable for the following components:   LDH 207 (*)    All other components within normal limits  TROPONIN I - Abnormal; Notable for the following components:   Troponin I 0.03 (*)    All other components within normal limits  CULTURE, BLOOD (ROUTINE X 2)  CULTURE, BLOOD (ROUTINE X 2)  URINE CULTURE  LACTIC ACID, PLASMA  PROTIME-INR  FERRITIN  PROCALCITONIN  BRAIN NATRIURETIC PEPTIDE  CBC WITH DIFFERENTIAL/PLATELET  COMPREHENSIVE METABOLIC PANEL  C-REACTIVE PROTEIN  D-DIMER, QUANTITATIVE (NOT AT [facility])  FERRITIN  LACTATE DEHYDROGENASE  MAGNESIUM  CK  INTERLEUKIN-6, PLASMA  PHOSPHORUS  TRIGLYCERIDES  IRON AND TIBC  ABO/RH    EKG EKG Interpretation  Date/Time:  Saturday November 26 2018 13:03:40 EDT Ventricular Rate:  80 PR Interval:    QRS Duration: 109 QT Interval:  405 QTC Calculation: 453 R Axis:   -42 Text Interpretation:  Sinus rhythm Multiple ventricular premature complexes Left anterior fascicular block Artifact in lead(s) I II aVR aVL aVF V1 V2 No previous ECGs available  Confirmed by Gareth Morgan [id number]) on 12/01/2018 1:18:40 PM   Radiology Ct Head Wo Contrast  Result Date: 12/01/2018 CLINICAL DATA:  Altered mental status. EXAM: CT HEAD WITHOUT CONTRAST TECHNIQUE: Contiguous axial images were obtained from the base of the skull through the vertex without intravenous contrast. COMPARISON:  None. FINDINGS: Brain: Expected cerebral and cerebellar volume loss for age. Moderate low density in the periventricular white matter likely related to small vessel disease. No mass lesion, hemorrhage, hydrocephalus, acute infarct, intra-axial, or extra-axial fluid collection. Vascular: Intracranial atherosclerosis. Skull: Normal Sinuses/Orbits: Surgical changes about both globes. Left maxillary sinus  mucous retention cyst or polyp. Clear mastoid air cells. Other: None. IMPRESSION: Normal head CT for age. No explanation for altered mental status. Cerebral atrophy and small vessel ischemic change. Electronically Signed   By: Jeronimo GreavesKyle  Talbot M.D.   On: 05/25/19 15:52   Dg Chest Portable 1 View  Result Date: 11/23/2018 CLINICAL DATA:  Unresponsive.  Altered mental status. EXAM: PORTABLE CHEST 1 VIEW COMPARISON:  None. FINDINGS: Lungs are adequately inflated without focal airspace consolidation or effusion. No pneumothorax. Cardiomediastinal silhouette is within normal. Mild degenerate change of the spine. IMPRESSION: No active disease. Electronically Signed   By: Elberta Fortisaniel  Boyle M.D.   On: 05/25/19 15:15    Procedures Procedures (including critical care time)  Medications Ordered in ED Medications  sodium chloride flush (NS) 0.9 % injection 3 mL (has no administration in time range)  0.9 %  sodium chloride infusion ( Intravenous Hold 12/07/2018 1836)  methylPREDNISolone sodium succinate (SOLU-MEDROL) 125 mg/2 mL injection 60 mg (0 mg Intravenous Hold 11/28/2018 1836)  enoxaparin (LOVENOX) injection 40 mg (has no administration in time range)  albuterol (VENTOLIN HFA) 108 (90 Base) MCG/ACT  inhaler 2 puff (2 puffs Inhalation Not Given 12/17/2018 1836)  guaiFENesin-dextromethorphan (ROBITUSSIN DM) 100-10 MG/5ML syrup 10 mL (has no administration in time range)  chlorpheniramine-HYDROcodone (TUSSIONEX) 10-8 MG/5ML suspension 5 mL (has no administration in time range)  vitamin C (ASCORBIC ACID) tablet 500 mg (500 mg Oral Not Given 12/01/2018 1835)  zinc sulfate capsule 220 mg (220 mg Oral Not Given 11/24/2018 1835)  ondansetron (ZOFRAN) tablet 4 mg (has no administration in time range)    Or  ondansetron (ZOFRAN) injection 4 mg (has no administration in time range)  sodium chloride 0.9 % bolus 500 mL (0 mLs Intravenous Hold 12/08/2018 1838)  acetaminophen (TYLENOL) suppository 650 mg (650 mg Rectal Given 11/29/2018 1413)     Initial Impression / Assessment and Plan / ED Course  I have reviewed the triage vital signs and the nursing notes.  Pertinent labs & imaging results that were available during my care of the patient were reviewed by me and considered in my medical decision making (see chart for details).        85yo spanish speaking male presents with concern for altered mental status.  Sick contacts, fever on arrival.  Neuro exam nonfocal, appears to have delirium related to acute infection, low suspicion for CVA.  Arrives to ED on nonrebreather, however quickly decreased to [place] and vital signs otherwise stable.  XR without acute abnormality. CBC, CMP without significant abnormality. CT head without acute abnormality.   COVID19 test positive.  Admitted for further care.    Final Clinical Impressions(s) / ED Diagnoses   Final diagnoses:  Altered mental status, unspecified altered mental status type  COVID-19 virus infection    ED Discharge Orders    None       Alvira MondaySchlossman, Vernida Mcnicholas, MD 02-24-2019 2020

## 2018-11-27 DIAGNOSIS — I639 Cerebral infarction, unspecified: Secondary | ICD-10-CM

## 2018-11-27 DIAGNOSIS — I6330 Cerebral infarction due to thrombosis of unspecified cerebral artery: Secondary | ICD-10-CM

## 2018-11-27 LAB — IRON AND TIBC
Iron: 10 ug/dL — ABNORMAL LOW (ref 45–182)
Saturation Ratios: 5 % — ABNORMAL LOW (ref 17.9–39.5)
TIBC: 219 ug/dL — ABNORMAL LOW (ref 250–450)
UIBC: 209 ug/dL

## 2018-11-27 LAB — COMPREHENSIVE METABOLIC PANEL
ALT: 23 U/L (ref 0–44)
AST: 50 U/L — ABNORMAL HIGH (ref 15–41)
Albumin: 2.9 g/dL — ABNORMAL LOW (ref 3.5–5.0)
Alkaline Phosphatase: 64 U/L (ref 38–126)
Anion gap: 7 (ref 5–15)
BUN: 19 mg/dL (ref 8–23)
CO2: 24 mmol/L (ref 22–32)
Calcium: 8 mg/dL — ABNORMAL LOW (ref 8.9–10.3)
Chloride: 108 mmol/L (ref 98–111)
Creatinine, Ser: 1.2 mg/dL (ref 0.61–1.24)
GFR calc Af Amer: 58 mL/min — ABNORMAL LOW (ref >60)
GFR calc non Af Amer: 50 mL/min — ABNORMAL LOW (ref >60)
Glucose, Bld: 153 mg/dL — ABNORMAL HIGH (ref 70–99)
Potassium: 4.3 mmol/L (ref 3.5–5.1)
Sodium: 139 mmol/L (ref 135–145)
Total Bilirubin: 0.8 mg/dL (ref 0.3–1.2)
Total Protein: 6.2 g/dL — ABNORMAL LOW (ref 6.5–8.1)

## 2018-11-27 LAB — CBC WITH DIFFERENTIAL/PLATELET
Abs Immature Granulocytes: 0.03 10*3/uL (ref 0.00–0.07)
Basophils Absolute: 0 10*3/uL (ref 0.0–0.1)
Basophils Relative: 0 %
Eosinophils Absolute: 0 10*3/uL (ref 0.0–0.5)
Eosinophils Relative: 0 %
HCT: 38.3 % — ABNORMAL LOW (ref 39.0–52.0)
Hemoglobin: 12.1 g/dL — ABNORMAL LOW (ref 13.0–17.0)
Immature Granulocytes: 1 %
Lymphocytes Relative: 8 %
Lymphs Abs: 0.4 10*3/uL — ABNORMAL LOW (ref 0.7–4.0)
MCH: 25.1 pg — ABNORMAL LOW (ref 26.0–34.0)
MCHC: 31.6 g/dL (ref 30.0–36.0)
MCV: 79.5 fL — ABNORMAL LOW (ref 80.0–100.0)
Monocytes Absolute: 0.2 10*3/uL (ref 0.1–1.0)
Monocytes Relative: 3 %
Neutro Abs: 4.1 10*3/uL (ref 1.7–7.7)
Neutrophils Relative %: 88 %
Platelets: 137 10*3/uL — ABNORMAL LOW (ref 150–400)
RBC: 4.82 MIL/uL (ref 4.22–5.81)
RDW: 16 % — ABNORMAL HIGH (ref 11.5–15.5)
WBC: 4.7 10*3/uL (ref 4.0–10.5)
nRBC: 0 % (ref 0.0–0.2)

## 2018-11-27 LAB — URINE CULTURE: Culture: NO GROWTH

## 2018-11-27 LAB — CK: Total CK: 1070 U/L — ABNORMAL HIGH (ref 49–397)

## 2018-11-27 LAB — MAGNESIUM: Magnesium: 2.2 mg/dL (ref 1.7–2.4)

## 2018-11-27 LAB — HEMOGLOBIN A1C
Hgb A1c MFr Bld: 6.1 % — ABNORMAL HIGH (ref 4.8–5.6)
Mean Plasma Glucose: 128.37 mg/dL

## 2018-11-27 LAB — C-REACTIVE PROTEIN: CRP: 16 mg/dL — ABNORMAL HIGH (ref <1.0)

## 2018-11-27 LAB — BRAIN NATRIURETIC PEPTIDE: B Natriuretic Peptide: 141.7 pg/mL — ABNORMAL HIGH (ref 0.0–100.0)

## 2018-11-27 LAB — FERRITIN: Ferritin: 135 ng/mL (ref 24–336)

## 2018-11-27 LAB — D-DIMER, QUANTITATIVE: D-Dimer, Quant: 1.58 ug/mL-FEU — ABNORMAL HIGH (ref 0.00–0.50)

## 2018-11-27 MED ORDER — ASPIRIN 300 MG RE SUPP
300.0000 mg | Freq: Every day | RECTAL | Status: DC
  Administered 2018-11-27 – 2018-11-28 (×2): 300 mg via RECTAL
  Filled 2018-11-27 (×3): qty 1

## 2018-11-27 MED ORDER — ASPIRIN 300 MG RE SUPP: 300.0000 mg | Freq: Every day | RECTAL | Status: DC

## 2018-11-27 MED ORDER — ALBUTEROL SULFATE HFA 108 (90 BASE) MCG/ACT IN AERS
2.0000 | INHALATION_SPRAY | Freq: Four times a day (QID) | RESPIRATORY_TRACT | Status: DC | PRN
  Administered 2018-11-28: 2 via RESPIRATORY_TRACT
  Filled 2018-11-27: qty 6.7

## 2018-11-27 MED ORDER — LORAZEPAM 2 MG/ML IJ SOLN
1.0000 mg | INTRAMUSCULAR | Status: DC | PRN
  Administered 2018-11-27 – 2018-11-28 (×2): 1 mg via INTRAVENOUS
  Filled 2018-11-27 (×2): qty 1

## 2018-11-27 MED ORDER — METHYLPREDNISOLONE SODIUM SUCC 125 MG IJ SOLR: 60.0000 mg | Freq: Two times a day (BID) | INTRAMUSCULAR | Status: DC

## 2018-11-27 MED ORDER — ASPIRIN 325 MG PO TABS
325.0000 mg | ORAL_TABLET | Freq: Every day | ORAL | Status: DC
  Filled 2018-11-27 (×4): qty 1

## 2018-11-27 MED ORDER — ASPIRIN 325 MG PO TABS
325.0000 mg | ORAL_TABLET | Freq: Every day | ORAL | Status: DC
  Filled 2018-11-27: qty 1

## 2018-11-27 NOTE — Consult Note (Signed)
Neurology Consultation  Reason for Consult: Stroke on MRI Referring Physician: Katrinka BlazingSmith, MD  CC: Stroke  History is obtained from: Chart  HPI: Jimmy Burns is a 85 y.o. male who is Spanish-speaking with past medical history of remote tobacco abuse, presented after being found acutely altered by family on the morning of 12/09/2018.  Last known normal was 10 PM at 6/5/ 2020. According to family he did not wake up like his normal self and was not talking or responding normally.  He also had been noted to have some bladder incontinence, which was atypical for him. There is close family contact with some upper respiratory viral illness-granddaughter had cough but tested negative for COVID-19 at that time.  No primary physician and no recent doctor visits have been performed. Patient was unable to provide any history at this time.  Due to his altered mental status, an MRI of the brain was performed that showed a left thalamic/capsular stroke for which neurological consultation was obtained.  Patient also tested positive for COVID-19.   LKW: 9 or 10 PM on 11/25/2018 tpa given?: no, outside the window Premorbid modified Rankin scale (mRS): Unable to obtain-family not available at bedside  ROS: Unable to obtain due to altered mental status.   History reviewed. No pertinent past medical history. Tobacco abuse Has not seen a doctor from many years-hence no past medical history History reviewed. No pertinent family history. No family member able to provide any family history at bedside.  Social History:   reports that he has quit smoking. He has never used smokeless tobacco. He reports that he does not drink alcohol or use drugs. Remote history of tobacco abuse Medications  Current Facility-Administered Medications:  .  0.9 %  sodium chloride infusion, , Intravenous, Continuous, Clydie BraunSmith, Rondell A, MD, Stopped at 12/09/2018 1836 .  albuterol (VENTOLIN HFA) 108 (90 Base) MCG/ACT inhaler 2 puff, 2  puff, Inhalation, Q6H, Madelyn FlavorsSmith, Rondell A, MD, 2 puff at 12/13/2018 2113 .  chlorpheniramine-HYDROcodone (TUSSIONEX) 10-8 MG/5ML suspension 5 mL, 5 mL, Oral, Q12H PRN, Smith, Rondell A, MD .  enoxaparin (LOVENOX) injection 40 mg, 40 mg, Subcutaneous, Daily, Smith, Rondell A, MD .  guaiFENesin-dextromethorphan (ROBITUSSIN DM) 100-10 MG/5ML syrup 10 mL, 10 mL, Oral, Q4H PRN, Smith, Rondell A, MD .  methylPREDNISolone sodium succinate (SOLU-MEDROL) 125 mg/2 mL injection 60 mg, 60 mg, Intravenous, Q12H, Leroy SeaSingh, Prashant K, MD, 60 mg at 11/24/2018 2110 .  ondansetron (ZOFRAN) tablet 4 mg, 4 mg, Oral, Q6H PRN **OR** ondansetron (ZOFRAN) injection 4 mg, 4 mg, Intravenous, Q6H PRN, Smith, Rondell A, MD .  sodium chloride 0.9 % bolus 500 mL, 500 mL, Intravenous, Once, Clydie BraunSmith, Rondell A, MD, Stopped at 12/15/2018 1838 .  sodium chloride flush (NS) 0.9 % injection 3 mL, 3 mL, Intravenous, Q12H, Smith, Rondell A, MD .  vitamin C (ASCORBIC ACID) tablet 500 mg, 500 mg, Oral, Daily, Smith, Rondell A, MD .  zinc sulfate capsule 220 mg, 220 mg, Oral, Daily, Smith, Rondell A, MD  Current Outpatient Medications:  .  acetaminophen (TYLENOL) 500 MG tablet, Take 500-1,000 mg by mouth every 6 (six) hours as needed (for pain)., Disp: , Rfl:   Exam: Current vital signs: BP (!) 135/56   Pulse 68   Temp 97.7 F (36.5 C) (Rectal)   Resp (!) 25   Ht 5\' 10"  (1.778 m)   Wt 63.5 kg   SpO2 98%   BMI 20.09 kg/m  Vital signs in last 24 hours: Temp:  [97.7 F (36.5  C)-100.7 F (38.2 C)] 97.7 F (36.5 C) (06/06 1530) Pulse Rate:  [54-76] 68 (06/06 2330) Resp:  [14-27] 25 (06/06 2330) BP: (112-159)/(47-78) 135/56 (06/06 2330) SpO2:  [90 %-100 %] 98 % (06/06 2330) Weight:  [63.5 kg] 63.5 kg (06/06 1339) General: Very drowsy, opens eyes to voice. HEENT: Normocephalic atraumatic dry mucous membranes Lungs: Clear to auscultation Cardiovascular: S1-S2 heard regular rate rhythm Abdomen: Soft nondistended nontender Extremities:  Warm well perfused Neurological exam Mental status: Drowsy, opens eyes to voice. Does not follow commands. Moans. Upon trying to examine his cranial nerves, he resists eye opening Pupils are equal round reactive to light No gaze deviation or preference Face appears symmetric On motor exam, he has obvious right-sided weakness with barely 2/5 right upper extremity and 3/5 right lower extremity.  Left side has 5/5 strength Sensory exam: Intact to sensation all over as based on response to noxious stimulation. Coordination difficult to assess NIHSS 1a Level of Conscious.: 1 1b LOC Questions: 2 1c LOC Commands: 2 2 Best Gaze: 0 3 Visual: 0 4 Facial Palsy: 0 5a Motor Arm - left: 0 5b Motor Arm - Right: 2 6a Motor Leg - Left: 0 6b Motor Leg - Right: 1 7 Limb Ataxia: 0 8 Sensory: 0 9 Best Language: 2 10 Dysarthria: 2 11 Extinct. and Inatten.: 0 TOTAL: 12  Labs I have reviewed labs in epic and the results pertinent to this consultation are:  CBC    Component Value Date/Time   WBC 7.5 02/27/2019 1322   RBC 4.70 02/27/2019 1322   HGB 12.0 (L) 02/27/2019 1322   HCT 36.6 (L) 02/27/2019 1322   PLT 142 (L) 02/27/2019 1322   MCV 77.9 (L) 02/27/2019 1322   MCH 25.5 (L) 02/27/2019 1322   MCHC 32.8 02/27/2019 1322   RDW 15.7 (H) 02/27/2019 1322   LYMPHSABS 0.9 02/27/2019 1322   MONOABS 0.7 02/27/2019 1322   EOSABS 0.2 02/27/2019 1322   BASOSABS 0.0 02/27/2019 1322    CMP     Component Value Date/Time   NA 136 02/27/2019 1322   K 4.2 02/27/2019 1322   CL 106 02/27/2019 1322   CO2 23 02/27/2019 1322   GLUCOSE 99 02/27/2019 1322   BUN 10 02/27/2019 1322   CREATININE 1.22 02/27/2019 1322   CALCIUM 8.0 (L) 02/27/2019 1322   PROT 6.1 (L) 02/27/2019 1322   ALBUMIN 2.9 (L) 02/27/2019 1322   AST 43 (H) 02/27/2019 1322   ALT 20 02/27/2019 1322   ALKPHOS 67 02/27/2019 1322   BILITOT 0.8 02/27/2019 1322   GFRNONAA 49 (L) 02/27/2019 1322   GFRAA 57 (L) 02/27/2019 1322    Imaging I have reviewed the images obtained:  CT-scan of the brain-read as no acute changes by radiology, but in hindsight did have the right thalamic capsular hypodensity.  MRI examination of the brain-acute/subacute ischemic stroke in the right thalamic capsular region.  Assessment: 85 year old man who has not been to a doctor in many years and has no past medical history other than that of remote tobacco abuse, brought in for evaluation of altered mental status.  An MRI done for checking for causes for altered mental status revealed a right-sided thalamic capsular stroke. He was also positive for COVID-19. I suspect that his stroke is due to small vessel etiology based on the location and his exam although his exam is marred by extreme inattention, likely secondary to his recent COVID-19 infection.  Impression: Small vessel acute ischemic stroke.  Recommendations: At this point,  I think the most important course of action should be to manage his COVID-19 infection.  Primary team has asked me for recommendations on whether to send him to the [facility], where the COVID-19 patients are being coordinated and I agree with the transfer to [facility].  At this point, doing an echocardiogram or looking at his head and neck blood vessels is not going to make any acute change in his management.  He should be on an antiplatelet and statin, which would be the management for a small vessel stroke.  He should get telemetry monitoring while in the hospital and might need outpatient telemetry to look for any evidence of atrial fibrillation, which will, if positive mean that he needs to be on chronic anticoagulation.  His hemoglobin A1c lipid panel should be checked.  He should receive frequent neurochecks.  But as said before, the primary focus of his care at this time should be managing his COVID-19 infection as given his advanced age, there is significantly high mortality due to  COVID-19 in this age group and the stroke risk factors can be worked up if and when he recovers from the COVID-19 infection.  I will sign out his care to my stroke team colleagues, who will follow him remotely.  -- Amie Portland, MD [facility] Pager: [phone] If 7pm to 7am, please call on call as listed on AMION.

## 2018-11-27 NOTE — Progress Notes (Signed)
Pt combative with x2 staff. Attempted to use interpreter for communication. Pt turned toward sound of interpreter but responded with incomprehensible language. RN used interpreter to explain to pt that he was in the hospital and had a stroke. Pt did not respond. Pt combative with staff and placed mitts on pt. Pt biting at staff and at mitts. Pt pulling lines and tubes, kicking at staff as well. Attempting to climb out of bed. Dr Sloan Leiter paged and placed orders for ativan IV.

## 2018-11-27 NOTE — Progress Notes (Signed)
Patient arrived to room 9144 via [facility] approx 0110. Patient responsive to painful stimuli. SB in 22s on monitor w/ frequenct PVCs. O2 sats 98-100% on RA. IVF started at 75cc/hr. Airborne and contact precautions initiated. Skin assessed w/ Onalee Hua, RN. Prophylactic dressing placed on sacrum. Overnight provider notified of patient's arrival to unit at 0245 and all orders reviewed.

## 2018-11-27 NOTE — Progress Notes (Signed)
Pt given IV ativan. Pt resting quietly now. VSS.

## 2018-11-27 NOTE — ED Notes (Signed)
[facility] called for transport. 

## 2018-11-27 NOTE — Progress Notes (Signed)
STROKE TEAM PROGRESS NOTE  Virtual Visit via Video Note  I connected with Jimmy Burns on @TODAY @ at  by a video enabled telemedicine application and verified that I am speaking with the correct person using two identifiers. This visit was performed using face time app for audio and visual.  The patient's bedside nurse Jimmy Burns was present throughout this visit and facilitated it. Location: Patient: At [facility] room [phone]#9144-01 Provider: [facility]   I discussed the limitations of evaluation and management by telemedicine and the availability of in person appointments. The patient`s medical hospitalist and bedside RN expressed understanding and agreed to proceed.  History of Present Illness: I have reviewed patient's history of presenting illness through electronic medical records and imaging films in PACS.  Patient is only Spanish-speaking but apparently aphasic and nonresponsive hence interpreter was not used.  Patient appears to be lying comfortably in bed with eyes closed.  He does not respond to auditory stimuli and opens eyes only partially to sternal rub.  He moves the left arm greater than leg purposefully and does not move the right side.  CT scan and MRI scan of the brain show large thalamic/internal capsule nonhemorrhagic infarct.  Hemoglobin A1c 6.1.  LDL cholesterol is pending.   Observations/Objective: Physical and neurological exam are limited due to constraints of virtual video visit.  Patient is stuporous and does not respond to auditory stimuli.  He opens eyes minimally to sternal rub.  He has downward gaze deviation with some rightward deviation as well.  Unable to assess pupils and fundi.  He has purposeful antigravity movement in the left arm greater than left leg.  He has no response to even painful stimuli in the right upper extremity which appears flaccid.  He has trace withdrawal in the right lower extremity to painful  stimuli.  Assessment and Plan: 85 year old Hispanic male with large left thalamic/capsular infarct likely from small vessel disease.  MRI also shows a remote age left posterior inferior cerebellar artery infarct as well as a coronary data lacunar infarct.  Patient has been found to be COVID positive but lacks the typical pulmonary symptoms Plan : Patient's neurological prognosis appears quite poor given his lethargy and significant right hemiplegia.  He will likely need feeding tube and prolonged stay in rehab and aggressive nursing care if fever to improve.  Dr. Jerral RalphGhimire medical hospitalist has spoken to patient's family who are thinking with her they want aggressive care and not and will make up their decision soon.  I do not believe ordering further tests like CT angiograms or echocardiogram or Doppler studies is going to change the patient's prognosis or treatment and would be challenging since patient is COVID positive and needs strict BP precautions and isolation.  Recommend start aspirin rectally and if family pursues aggressive care feeding tube and therapy and rehab.  Follow lipid profile results.  May consider continuing further stroke or stratification work-up if family decides on pursuing aggressive care. Follow Up Instructions: Continue aspirin for stroke prevention and follow lipid profile results.  Further stroke work-up will depend upon family decision about how aggressive they want patient's care to be.  Stroke team will sign off.  Kindly call for questions.  Discussed with Dr.Ghimire   I discussed the assessment and treatment plan with the patient. The patient was provided an opportunity to ask questions and all were answered. The patient agreed with the plan and demonstrated an understanding of the instructions.  The patient was advised to call back or seek an in-person evaluation if the symptoms worsen or if the condition fails to improve as anticipated.  I provided 25 minutes of  non-face-to-face time during this encounter.   Jimmy Burns , Jimmy Burns   HISTORY OF PRESENT ILLNESS (per record) Jimmy Burns is a 85 y.o. male who is Spanish-speaking with past medical history of remote tobacco abuse, presented after being found acutely altered by family on the morning of 11/24/2018.  Last known normal was 10 PM at 6/5/ 2020. According to family he did not wake up like his normal self and was not talking or responding normally.  He also had been noted to have some bladder incontinence, which was atypical for him. There is close family contact with some upper respiratory viral illness-granddaughter had cough but tested negative for COVID-19 at that time.  No primary physician and no recent doctor visits have been performed. Patient was unable to provide any history at this time.  Due to his altered mental status, an MRI of the brain was performed that showed a left thalamic/capsular stroke for which neurological consultation was obtained.  Patient also tested positive for COVID-19.  LKW: 9 or 10 PM on 11/25/2018 tpa given?: no, outside the window Premorbid modified Rankin scale (mRS): Unable to obtain-family not available at bedside   SUBJECTIVE (INTERVAL HISTORY) His  RN is at the bedside.   He remains obtunded and not following commands    OBJECTIVE Vitals:   11/27/18 0429 11/27/18 0500 11/27/18 0700 11/27/18 0800  BP: (!) 109/57 (!) 127/59 127/71 138/66  Pulse: (!) 55 (!) 49 (!) 48 (!) 49  Resp: (!) 21 (!) 25 15 18   Temp: (!) 96.8 F (36 C)   (!) 96.5 F (35.8 C)  TempSrc: Axillary     SpO2: 100% 100% 100% 100%  Weight:      Height:        CBC:  Recent Labs  Lab 12/15/2018 1322 11/27/18 0852  WBC 7.5 4.7  NEUTROABS 5.7 4.1  HGB 12.0* 12.1*  HCT 36.6* 38.3*  MCV 77.9* 79.5*  PLT 142* 137*    Basic Metabolic Panel:  Recent Labs  Lab 11/29/2018 1322 11/27/18 0852  NA 136 139  K 4.2 4.3  CL 106 108  CO2 23 24  GLUCOSE 99 153*  BUN 10 19   CREATININE 1.22 1.20  CALCIUM 8.0* 8.0*  MG  --  2.2    Lipid Panel: No results found for: CHOL, TRIG, HDL, CHOLHDL, VLDL, LDLCALC HgbA1c:  Lab Results  Component Value Date   HGBA1C 6.1 (H) 11/27/2018   Urine Drug Screen: No results found for: LABOPIA, COCAINSCRNUR, LABBENZ, AMPHETMU, THCU, LABBARB  Alcohol Level No results found for: ETH   IMAGING  Ct Head Wo Contrast 11/29/2018 IMPRESSION:  Normal head CT for age. No explanation for altered mental status. Cerebral atrophy and small vessel ischemic change.   Mr Brain Wo Contrast 12/20/2018 IMPRESSION:  1. Acute/subacute nonhemorrhagic infarct involving the posterior limb left internal capsule and left thalamus. The infarct measures 2.5 x 1.2 x 1.5 cm.  2. Atrophy and white matter disease suggests underlying microvascular ischemia.  3. Remote nonhemorrhagic left PICA territory infarcts.  4. Remote punctate infarct of the left centrum semi ovale.   Dg Chest Portable 1 View 12/11/2018 IMPRESSION:  No active disease.    EKG - SR rate 80 BPM. (See cardiology reading for complete details)   PHYSICAL EXAM Blood pressure 138/66, pulse (!) 49,  temperature (!) 96.5 F (35.8 C), resp. rate 18, height 5\' 10"  (1.778 m), weight 63.5 kg, SpO2 100 %. Frail elderly Hispanic male who appears not to be in distress. . Afebrile. Head is nontraumatic. Neck is supple without bruit.    Cardiac exam no murmur or gallop. Lungs are clear to auscultation. Distal pulses are well felt. Neurological Exam Patient is stuporous and does not respond to auditory stimuli.  He opens eyes minimally to sternal rub.  He has downward gaze deviation with some rightward deviation as well.  Unable to assess pupils and fundi.  He has purposeful antigravity movement in the left arm greater than left leg.  He has no response to even painful stimuli in the right upper extremity which appears flaccid.  He has trace withdrawal in the right lower extremity to painful  stimuli.     ASSESSMENT/PLAN Jimmy Burns is a 85 y.o. [place] speaking male with history of remote tobacco use presenting with AMS. He did not receive IV t-PA due to late presentation (>4.5 hours from time of onset)   Strokes: acute/subacute nonhemorrhagic infarct involving the posterior limb left internal capsule and left thalamus - small vessel disease ?Hypercoagulable?  Resultant obtundation and right hemiplegia   CT head - normal for age  MRI head - Acute/subacute nonhemorrhagic infarct involving the posterior limb left internal capsule and left thalamus. The infarct measures 2.5 x 1.2 x 1.5 cm. Remote nonhemorrhagic left PICA territory infarcts. Remote punctate infarct of the left centrum semi ovale.   MRA head  - not ordered  CTA H&N  - not ordered  Carotid Doppler  - not ordered  2D Echo  - not ordered  [employer] Virus 2 - positive  LDL - not ordered  HgbA1c - 6.1  UDS  - not ordered  VTE prophylaxis - Lovenox  Diet  - not ordered  No antithrombotic prior to admission, now on aspirin 325 mg daily  Patient and family will be counseled to be compliant with his antithrombotic medications  Ongoing aggressive stroke risk factor management  Therapy recommendations:  pending  Disposition:  Pending  Hypertension  Stable . Permissive hypertension (OK if < 220/120) but gradually normalize in 5-7 days . Long-term BP goal normotensive  Hyperlipidemia  Lipid lowering medication PTA:  none  LDL  - not ordered, goal < 70  Current lipid lowering medication: none  Continue statin at discharge   Other Stroke Risk Factors  Advanced age  Former cigarette smoker - quit  Hx stroke/TIA by imaging   Other Active Problems  Mild anemia  Mild thrombocytopenia  Covid 19 positive  Bradycardia   PLAN  Treat Covid first - then finish stroke workup.  Hospital day # 1  I have personally obtained history,examined this patient, reviewed notes,  independently viewed imaging studies, participated in medical decision making and plan of care.ROS completed by me personally and pertinent positives fully documented  I have made any additions or clarifications directly to the above note. I have spent a total of    56minutes with the patient reviewing hospital notes,  test results, labs and examining the patient as well as establishing an assessment and plan that was discussed personally with the patient.  > 50% of time was spent in direct patient care.  Continue aspirin for stroke prevention.  Stroke team will sign off.  Kindly call for questions.  Discussed with Dr.Ghimire     Jimmy Contras, Jimmy Burns Medical Director [facility] Pager:  [phone] 11/27/2018 2:44 PM   To contact Stroke Continuity provider, please refer to [email or web address]. After hours, contact General Neurology

## 2018-11-27 NOTE — Progress Notes (Addendum)
PROGRESS NOTE                                                                                                                                                                                                             Patient Demographics:    Jimmy Burns, is a 85 y.o. male, DOB - 06/23/1917, RUE:[id number]  Outpatient Primary MD for the patient is Jimmy Burns    LOS - 1  Chief Complaint  Patient presents with   Altered Mental Status       Brief Narrative: Patient is a 85 y.o. male with no past medical history-brought in for altered mental status, further work-up revealed right-sided weakness-confirmed to have acute CVA on MRI.  Furthermore COVID-19 also positive.  See below for further details   Subjective:    Jimmy Burns lethargic-hard to arouse.  He mumbles incoherently when a vigorous sternal rub is performed.  He appears to have significant right-sided weakness.   Assessment  & Plan :   Acute CVA involving the posterior limb internal capsule and left thalamus: Likely lacunar infarct-patient with significant right-sided deficits and dysphagia.  Given advanced age-further work-up including echo/Dopplers/CTA neck will not change management-furthermore this probably reflects small vessel disease as a lacunar infarct.  Spoke with Dr. Priscille HeidelbergSethi-stroke MD-we both agree that patient is best served by gentle medical treatment at this point.  Await evaluation by speech therapy and rehab services-but suspect that if dysphagia does not improve-he will probably require hospice care.  Spoke at length with patient's granddaughter over the phone-she is aware of the plan to monitor for a few days-if he remains with severe dysphagia in spite of supportive care-he is hospice appropriate.  In the meantime-continue with aspirin via rectal suppository.  COVID-19: On room air-chest x-ray without any infiltrates.  Some elevation in  CRP-but otherwise without any clinical features-likely asymptomatic.  Do not think patient requires IV steroids-we will discontinue.  COVID-19 Labs:  Recent Labs    11/28/2018 1812 11/27/18 0852  DDIMER 1.29* 1.58*  FERRITIN 122  --   LDH 207*  --   CRP 11.4* 16.0*    Lab Results  Component Value Date   SARSCOV2NAA POSITIVE (A) 11/21/2018     COVID-19 Medications: 6/6>>6/7 Solumedrol   Mild transaminitis: Likely secondary to COVID-19-no further work-up required-as very mild-we will follow periodically.  Palliative care: DNR in place-given advanced age-not a candidate for any further escalation in care.  As noted above-further stroke work-up will likely not change outcome of management.  Main issue at this point will be to see if his dysphagia improves-if not-he will probably need to be transition to comfort measures.  Will avoid Panda tube placement.  Do not think that PEG tube is a viable option at this frail and elderly patient.  Condition - Extremely Guarded  Family Communication  : grand-daughter updated over the phone  Code Status :  DNR  Diet :  Diet Order    None       Disposition Plan  :  Remain inpatient  Consults  :Neurology  Procedures  :  None  DVT Prophylaxis  :  Lovenox   Lab Results  Component Value Date   PLT 137 (L) 11/27/2018    Inpatient Medications  Scheduled Meds:  albuterol  2 puff Inhalation Q6H   aspirin  300 mg Rectal Daily   Or   aspirin  325 mg Oral Daily   enoxaparin (LOVENOX) injection  40 mg Subcutaneous Daily   methylPREDNISolone (SOLU-MEDROL) injection  60 mg Intravenous Q12H   sodium chloride flush  3 mL Intravenous Q12H   vitamin C  500 mg Oral Daily   zinc sulfate  220 mg Oral Daily   Continuous Infusions:  sodium chloride 75 mL/hr at 11/27/18 0500   sodium chloride Stopped (12/06/2018 1838)   PRN Meds:.chlorpheniramine-HYDROcodone, guaiFENesin-dextromethorphan, ondansetron **OR** ondansetron (ZOFRAN)  IV  Antibiotics  :    Anti-infectives (From admission, onward)   None       Time Spent in minutes  35    Jeoffrey Massed M.D on 11/27/2018 at 11:11 AM  To page go to [email or web address] - use universal password  [facility] -  Office  [phone]   Admit date - 12/04/2018    1    Objective:   Vitals:   11/27/18 0429 11/27/18 0500 11/27/18 0700 11/27/18 0800  BP: (!) 109/57 (!) 127/59 127/71 138/66  Pulse: (!) 55 (!) 49 (!) 48 (!) 49  Resp: (!) 21 (!) Temp: (!) 96.8 F (36 C)   (!) 96.5 F (35.8 C)  TempSrc: Axillary     SpO2: 100% 100% 100% 100%  Weight:      Height:        Wt Readings from Last 3 Encounters:  12/10/2018 63.5 kg     Intake/Output Summary (Last 24 hours) at 11/27/2018 1111 Last data filed at 11/27/2018 0500 Gross Burns 24 hour  Intake 244.92 ml  Output 0 ml  Net 244.92 ml     Physical Exam Gen Exam: Lethargic-mumbles incoherently with a vigorous sternal rub. HEENT:atraumatic, normocephalic.  Mild right facial droop Chest: B/L clear to auscultation anteriorly CVS:S1S2 regular Abdomen:soft non tender, non distended Extremities:no edema Neurology: Difficult exam-but appears to have significant right-sided deficits-just about barely able to move hands/legs side-by-side-not able to left off the bed. Skin: no rash   Data Review:    CBC Recent Labs  Lab 11/25/2018 1322 11/27/18 0852  WBC 7.5 4.7  HGB 12.0* 12.1*  HCT 36.6* 38.3*  PLT 142* 137*  MCV 77.9* 79.5*  MCH 25.5* 25.1*  MCHC 32.8 31.6  RDW 15.7* 16.0*  LYMPHSABS 0.9 0.4*  MONOABS 0.7 0.2  EOSABS 0.2 0.0  BASOSABS 0.0 0.0    Chemistries  Recent Labs  Lab 12/07/2018 1322 11/27/18 0852  NA 136 139  K  4.2 4.3  CL 106 108  CO2 23 24  GLUCOSE 99 153*  BUN 10 19  CREATININE 1.22 1.20  CALCIUM 8.0* 8.0*  MG  --  2.2  AST 43* 50*  ALT 20 23  ALKPHOS 67 64  BILITOT 0.8 0.8    ------------------------------------------------------------------------------------------------------------------ No results for input(s): CHOL, HDL, LDLCALC, TRIG, CHOLHDL, LDLDIRECT in the last 72 hours.  No results found for: HGBA1C ------------------------------------------------------------------------------------------------------------------ No results for input(s): TSH, T4TOTAL, T3FREE, THYROIDAB in the last 72 hours.  Invalid input(s): FREET3 ------------------------------------------------------------------------------------------------------------------ Recent Labs    12/06/2018 1812  FERRITIN 122    Coagulation profile Recent Labs  Lab 12/07/2018 1322  INR 1.0    Recent Labs    11/25/2018 1812 11/27/18 0852  DDIMER 1.29* 1.58*    Cardiac Enzymes Recent Labs  Lab 11/25/2018 1812  TROPONINI 0.03*   ------------------------------------------------------------------------------------------------------------------    Component Value Date/Time   BNP 178.3 (H) 12/12/2018 1812    Micro Results Recent Results (from the past 240 hour(s))  SARS Coronavirus 2 (CEPHEID - Performed in [facility] lab), Hosp Order     Status: Abnormal   Collection Time: 12/02/2018  1:14 PM  Result Value Ref Range Status   SARS Coronavirus 2 POSITIVE (A) NEGATIVE Final    Comment: CRITICAL RESULT CALLED TO, READ BACK BY AND VERIFIED WITH: RN KEVIN B. [phone] FCP (NOTE) If result is NEGATIVE SARS-CoV-2 target nucleic acids are NOT DETECTED. The SARS-CoV-2 RNA is generally detectable in upper and lower  respiratory specimens during the acute phase of infection. The lowest  concentration of SARS-CoV-2 viral copies this assay can detect is 250  copies / mL. A negative result does not preclude SARS-CoV-2 infection  and should not be used as the sole basis for treatment or other  patient management decisions.  A negative result may occur with  improper specimen collection /  handling, submission of specimen other  than nasopharyngeal swab, presence of viral mutation(s) within the  areas targeted by this assay, and inadequate number of viral copies  (<250 copies / mL). A negative result must be combined with clinical  observations, patient history, and epidemiological information. If result is POSITIVE SARS-CoV-2 target nucleic acids are DETECTED.  The SARS-CoV-2 RNA is generally detectable in upper and lower  respiratory specimens during the acute phase of infection.  Positive  results are indicative of active infection with SARS-CoV-2.  Clinical  correlation with patient history and other diagnostic information is  necessary to determine patient infection status.  Positive results do  not rule out bacterial infection or co-infection with other viruses. If result is PRESUMPTIVE POSTIVE SARS-CoV-2 nucleic acids MAY BE PRESENT.   A presumptive positive result was obtained on the submitted specimen  and confirmed on repeat testing.  While 2019 novel coronavirus  (SARS-CoV-2) nucleic acids may be present in the submitted sample  additional confirmatory testing may be necessary for epidemiological  and / or clinical management purposes  to differentiate between  SARS-CoV-2 and other Sarbecovirus currently known to infect humans.  If clinically indicated additional testing with an alternate test  methodology [phone]([id number]) i s advised. The SARS-CoV-2 RNA is generally  detectable in upper and lower respiratory specimens during the acute  phase of infection. The expected result is Negative. Fact Sheet for Patients:  BoilerBrush.com.cyhttps://www.fda.gov/media/136312/download Fact Sheet for Healthcare Providers: https://pope.com/https://www.fda.gov/media/136313/download This test is not yet approved or cleared by the [place] FDA and has been authorized for detection and/or diagnosis of SARS-CoV-2 by FDA  under an Emergency Use Authorization (EUA).  This EUA will remain in effect (meaning this  test can be used) for the duration of the COVID-19 declaration under Section 564(b)(1) of the Act, 21 U.S.C. section 360bbb-3(b)(1), unless the authorization is terminated or revoked sooner. Performed at [facility], [place] [address] [place], [place] [address]   Culture, blood (Routine x 2)     Status: None (Preliminary result)   Collection Time: 2018-12-18  1:22 PM  Result Value Ref Range Status   Specimen Description BLOOD LEFT ARM  Final   Special Requests   Final    BOTTLES DRAWN AEROBIC AND ANAEROBIC Blood Culture adequate volume   Culture   Final    NO GROWTH < 24 HOURS Performed at [facility], [place] [address], [place], [place] [address]    Report Status PENDING  Incomplete  Urine culture     Status: None   Collection Time: Dec 18, 2018  1:26 PM  Result Value Ref Range Status   Specimen Description URINE, RANDOM  Final   Special Requests NONE  Final   Culture   Final    NO GROWTH Performed at [facility], [address], [place], [place] [address]    Report Status 11/27/2018 FINAL  Final  Culture, blood (Routine x 2)     Status: None (Preliminary result)   Collection Time: 2018-12-18  2:16 PM  Result Value Ref Range Status   Specimen Description BLOOD LEFT FOREARM  Final   Special Requests AEROBIC BOTTLE ONLY Blood Culture adequate volume  Final   Culture   Final    NO GROWTH < 24 HOURS Performed at [facility], [place] [address], [place], [place] [address]    Report Status PENDING  Incomplete    Radiology Reports Ct Head Wo Contrast  Result Date: 12-18-18 CLINICAL DATA:  Altered mental status. EXAM: CT HEAD WITHOUT CONTRAST TECHNIQUE: Contiguous axial images were obtained from the base of the skull through the vertex without intravenous contrast. COMPARISON:  None. FINDINGS: Brain: Expected cerebral and cerebellar volume loss for age. Moderate low density in the periventricular white matter likely related to small vessel disease. No mass lesion,  hemorrhage, hydrocephalus, acute infarct, intra-axial, or extra-axial fluid collection. Vascular: Intracranial atherosclerosis. Skull: Normal Sinuses/Orbits: Surgical changes about both globes. Left maxillary sinus mucous retention cyst or polyp. Clear mastoid air cells. Other: None. IMPRESSION: Normal head CT for age. No explanation for altered mental status. Cerebral atrophy and small vessel ischemic change. Electronically Signed   By: Abigail Miyamoto M.D.   On: 12/18/18 15:52   Mr Brain Wo Contrast  Result Date: 12-18-18 CLINICAL DATA:  Altered mental status. Patient last seen well at 9 p.m. last night. Patient did not awake is usual. Fever. EXAM: MRI HEAD WITHOUT CONTRAST TECHNIQUE: Multiplanar, multiecho pulse sequences of the brain and surrounding structures were obtained without intravenous contrast. COMPARISON:  CT head without contrast 12-18-18 FINDINGS: Brain: The diffusion-weighted images demonstrate acute nonhemorrhagic infarct involving the posterior limb left internal capsule and lateral left thalamus. Moderate generalized atrophy is present. Confluent periventricular white matter disease is present. A remote lacunar infarct is present in the left centrum semi ovale. No blood products are evident. The ventricles are proportionate to the degree of atrophy. The internal auditory canals are within normal limits. The brainstem is normal. Remote left PICA territory infarcts are present. Small remote lacunar infarcts are present the posterior cerebellum bilaterally separate from the PICA territory infarct. Vascular:  Flow is present in the major intracranial arteries. Skull and upper cervical spine: The craniocervical junction is normal. Upper cervical spine is within normal limits. Marrow signal is unremarkable. Sinuses/Orbits: Chronic mucosal thickening is present in the left maxillary sinus without a fluid level. The paranasal sinuses and mastoid air cells are otherwise clear. The globes and orbits  are within normal limits. IMPRESSION: 1. Acute/subacute nonhemorrhagic infarct involving the posterior limb left internal capsule and left thalamus. The infarct measures 2.5 x 1.2 x 1.5 cm. 2. Atrophy and white matter disease suggests underlying microvascular ischemia. 3. Remote nonhemorrhagic left PICA territory infarcts. 4. Remote punctate infarct of the left centrum semi ovale. Electronically Signed   By: Marin Robertshristopher  Mattern M.D.   On: 12/06/2018 22:46   Dg Chest Portable 1 View  Result Date: 11/29/2018 CLINICAL DATA:  Unresponsive.  Altered mental status. EXAM: PORTABLE CHEST 1 VIEW COMPARISON:  None. FINDINGS: Lungs are adequately inflated without focal airspace consolidation or effusion. No pneumothorax. Cardiomediastinal silhouette is within normal. Mild degenerate change of the spine. IMPRESSION: No active disease. Electronically Signed   By: Elberta Fortisaniel  Boyle M.D.   On: 12/06/2018 15:15

## 2018-11-27 NOTE — Evaluation (Signed)
Occupational Therapy Evaluation Patient Details Name: Jimmy Burns MRN: [id number] DOB: 06/21/1918 Today's Date: 11/27/2018    History of Present Illness 85 y.o. Spanish-speaking male with past medical history significant for remote tobacco abuse; who presents with AMS per family. MRI finding acute/subacute nonhemorrhagic infarct including the posterior limb left internal capsuale and left thalamus.    Clinical Impression   Per chart review, pt was living with his family at home and was independent. Pt currently requiring Max-total A for ADLs and bed mobility due to confusion, decreased functional use of RUE, and decreased activity tolerance with poor SpO2. Upon arrival, pt supine in bed and awake; RN requesting assistance to calm patient and place on oxygen via [place] and mitts.  Pt SpO2 70s on RA. Upon touch, pt pulling his LUE back and swinging at OT and RN. Attempting to calm patient and able to don bilateral mitts. Pt with increased tone at RUE and grimacing with PROM into elbow extension. Pt would benefit from further acute OT to facilitate safe dc. Recommend dc to SNF for further OT to optimize safety, independence with ADLs, and return to PLOF.      Follow Up Recommendations  SNF    Equipment Recommendations  Other (comment)(Defer to next venue)    Recommendations for Other Services PT consult     Precautions / Restrictions Precautions Precautions: Fall      Mobility Bed Mobility Overal bed mobility: Needs Assistance             General bed mobility comments: Max-Total A for repositioning. Pt wanting to lean to the right and slide down into the bed.   Transfers                 General transfer comment: Defered for safety    Balance                                           ADL either performed or assessed with clinical judgement   ADL Overall ADL's : Needs assistance/impaired                                       General  ADL Comments: Pt requiring Max-Total A for ADLs due to confusion and agitation. Pt also with decreased functional use of RUE.      Vision         Perception     Praxis      Pertinent Vitals/Pain Pain Assessment: Faces Faces Pain Scale: Hurts a little bit Pain Location: Grimacing with extension of RUE Pain Descriptors / Indicators: Grimacing;Discomfort Pain Intervention(s): Monitored during session;Repositioned;Limited activity within patient's tolerance     Hand Dominance     Extremity/Trunk Assessment Upper Extremity Assessment Upper Extremity Assessment: RUE deficits/detail RUE Deficits / Details: Decreased AROM of RUE. Increased tone noted at endge range. Pt grimacing with PROM of elbow into extension RUE Coordination: decreased fine motor;decreased gross motor   Lower Extremity Assessment Lower Extremity Assessment: Defer to PT evaluation       Communication Communication Communication: Prefers language other than English(Spanish)   Cognition Arousal/Alertness: Awake/alert Behavior During Therapy: Agitated;Restless Overall Cognitive Status: Difficult to assess  General Comments: Upon arrival, Pt supine in bed and RN requesting assistance to don mits on pt so he would not take off O2 [place]. Difficulty to fully assess due to language barrier. Pt attempting to bite off mit once placed on.   General Comments  SpO2 70s on RA and attempting to place 2L O2 but pt pulling [place] off    Exercises     Shoulder Instructions      Home Living Family/patient expects to be discharged to:: Private residence                                 Additional Comments: Per chart review, pt lives at home with his family      Prior Functioning/Environment Level of Independence: Independent        Comments: Per chart review, pt was independent with ADLs and mobility        OT Problem List: Decreased strength;Decreased range  of motion;Decreased activity tolerance;Impaired balance (sitting and/or standing);Decreased cognition;Decreased safety awareness;Decreased knowledge of use of DME or AE;Decreased knowledge of precautions;Decreased coordination;Impaired UE functional use;Pain      OT Treatment/Interventions: Self-care/ADL training;Therapeutic exercise;Energy conservation;DME and/or AE instruction;Therapeutic activities;Patient/family education    OT Goals(Current goals can be found in the care plan section) Acute Rehab OT Goals Patient Stated Goal: Unstated OT Goal Formulation: Patient unable to participate in goal setting Time For Goal Achievement: 12/11/18 Potential to Achieve Goals: Good  OT Frequency: Min 3X/week   Barriers to D/C:            Co-evaluation              AM-PAC OT "6 Clicks" Daily Activity     Outcome Measure Help from another person eating meals?: Total Help from another person taking care of personal grooming?: A Lot Help from another person toileting, which includes using toliet, bedpan, or urinal?: A Lot Help from another person bathing (including washing, rinsing, drying)?: Total Help from another person to put on and taking off regular upper body clothing?: A Lot Help from another person to put on and taking off regular lower body clothing?: Total 6 Click Score: 9   End of Session Equipment Utilized During Treatment: Oxygen Nurse Communication: Mobility status  Activity Tolerance: Treatment limited secondary to agitation Patient left: in bed;with call bell/phone within reach;with nursing/sitter in room;with bed alarm set  OT Visit Diagnosis: Unsteadiness on feet (R26.81);Other abnormalities of gait and mobility (R26.89);Muscle weakness (generalized) (M62.81);Other symptoms and signs involving cognitive function;Hemiplegia and hemiparesis Hemiplegia - Right/Left: Right Hemiplegia - caused by: Cerebral infarction                Time: [id number] OT Time Calculation  (min): 12 min Charges:  OT General Charges $OT Visit: 1 Visit OT Evaluation $OT Eval Moderate Complexity: 1 Mod  Shantinique Picazo MSOT, OTR/L Acute Rehab Pager: [phone] Office: [phone]  Theodoro GristCharis M Kleigh Hoelzer 11/27/2018, 4:52 PM

## 2018-11-27 NOTE — Progress Notes (Addendum)
Spoke with pt's granddaughter via [employer] phone. Explained that pt is not eating, moving, or talking on his own. She is aware that pt had a stroke. Explained spokesperson role. Pt's granddaughter Minus Breeding, does not speak english well. Will need to call with interpreter via telephone for accurate communication.

## 2018-11-28 LAB — LIPID PANEL
Cholesterol: 123 mg/dL (ref 0–200)
HDL: 36 mg/dL — ABNORMAL LOW
LDL Cholesterol: 59 mg/dL (ref 0–99)
Total CHOL/HDL Ratio: 3.4 ratio
Triglycerides: 140 mg/dL
VLDL: 28 mg/dL (ref 0–40)

## 2018-11-28 LAB — COMPREHENSIVE METABOLIC PANEL
ALT: 22 U/L (ref 0–44)
AST: 48 U/L — ABNORMAL HIGH (ref 15–41)
Albumin: 3.1 g/dL — ABNORMAL LOW (ref 3.5–5.0)
Alkaline Phosphatase: 63 U/L (ref 38–126)
Anion gap: 9 (ref 5–15)
BUN: 22 mg/dL (ref 8–23)
CO2: 23 mmol/L (ref 22–32)
Calcium: 8.4 mg/dL — ABNORMAL LOW (ref 8.9–10.3)
Chloride: 113 mmol/L — ABNORMAL HIGH (ref 98–111)
Creatinine, Ser: 1.08 mg/dL (ref 0.61–1.24)
GFR calc Af Amer: >60 mL/min (ref >60)
GFR calc non Af Amer: 57 mL/min — ABNORMAL LOW (ref >60)
Glucose, Bld: 103 mg/dL — ABNORMAL HIGH (ref 70–99)
Potassium: 4.6 mmol/L (ref 3.5–5.1)
Sodium: 145 mmol/L (ref 135–145)
Total Bilirubin: 0.6 mg/dL (ref 0.3–1.2)
Total Protein: 6.5 g/dL (ref 6.5–8.1)

## 2018-11-28 LAB — CBC WITH DIFFERENTIAL/PLATELET
Abs Immature Granulocytes: 0.08 10*3/uL — ABNORMAL HIGH (ref 0.00–0.07)
Basophils Absolute: 0 10*3/uL (ref 0.0–0.1)
Basophils Relative: 0 %
Eosinophils Absolute: 0 10*3/uL (ref 0.0–0.5)
Eosinophils Relative: 0 %
HCT: 40.4 % (ref 39.0–52.0)
Hemoglobin: 13.2 g/dL (ref 13.0–17.0)
Immature Granulocytes: 1 %
Lymphocytes Relative: 11 %
Lymphs Abs: 1.1 10*3/uL (ref 0.7–4.0)
MCH: 25.9 pg — ABNORMAL LOW (ref 26.0–34.0)
MCHC: 32.7 g/dL (ref 30.0–36.0)
MCV: 79.4 fL — ABNORMAL LOW (ref 80.0–100.0)
Monocytes Absolute: 0.7 10*3/uL (ref 0.1–1.0)
Monocytes Relative: 7 %
Neutro Abs: 8.6 10*3/uL — ABNORMAL HIGH (ref 1.7–7.7)
Neutrophils Relative %: 81 %
Platelets: 160 10*3/uL (ref 150–400)
RBC: 5.09 MIL/uL (ref 4.22–5.81)
RDW: 16.1 % — ABNORMAL HIGH (ref 11.5–15.5)
WBC: 10.5 10*3/uL (ref 4.0–10.5)
nRBC: 0 % (ref 0.0–0.2)

## 2018-11-28 LAB — BRAIN NATRIURETIC PEPTIDE: B Natriuretic Peptide: 217.9 pg/mL — ABNORMAL HIGH (ref 0.0–100.0)

## 2018-11-28 LAB — D-DIMER, QUANTITATIVE: D-Dimer, Quant: 1.59 ug{FEU}/mL — ABNORMAL HIGH (ref 0.00–0.50)

## 2018-11-28 LAB — LACTATE DEHYDROGENASE: LDH: 262 U/L — ABNORMAL HIGH (ref 98–192)

## 2018-11-28 LAB — CK: Total CK: 673 U/L — ABNORMAL HIGH (ref 49–397)

## 2018-11-28 LAB — FERRITIN: Ferritin: 164 ng/mL (ref 24–336)

## 2018-11-28 LAB — C-REACTIVE PROTEIN: CRP: 10.4 mg/dL — ABNORMAL HIGH

## 2018-11-28 LAB — MAGNESIUM: Magnesium: 2.2 mg/dL (ref 1.7–2.4)

## 2018-11-28 MED ORDER — LORAZEPAM 2 MG/ML IJ SOLN
0.5000 mg | INTRAMUSCULAR | Status: DC | PRN
  Administered 2018-11-28: 0.5 mg via INTRAVENOUS
  Filled 2018-11-28: qty 1

## 2018-11-28 MED ORDER — HYDRALAZINE HCL 20 MG/ML IJ SOLN: 10.0000 mg | Freq: Four times a day (QID) | INTRAMUSCULAR | Status: DC | PRN

## 2018-11-28 MED ORDER — CHLORHEXIDINE GLUCONATE 0.12 % MT SOLN
15.0000 mL | Freq: Two times a day (BID) | OROMUCOSAL | Status: DC
  Administered 2018-11-28 – 2018-11-29 (×2): 15 mL via OROMUCOSAL
  Filled 2018-11-28: qty 15

## 2018-11-28 MED ORDER — ORAL CARE MOUTH RINSE: 15.0000 mL | Freq: Two times a day (BID) | OROMUCOSAL | Status: DC

## 2018-11-28 MED ORDER — ACETAMINOPHEN 10 MG/ML IV SOLN
1000.0000 mg | Freq: Four times a day (QID) | INTRAVENOUS | Status: DC | PRN
  Administered 2018-11-28: 1000 mg via INTRAVENOUS
  Filled 2018-11-28: qty 100

## 2018-11-28 MED ORDER — ENSURE ENLIVE PO LIQD: 237.0000 mL | Freq: Two times a day (BID) | ORAL | Status: DC

## 2018-11-28 NOTE — Progress Notes (Signed)
Went to check on pt and conduct 12am VS.  Pt was found to be wet and changed.  Pt became agitated during care and started to swing his left hand at staff and attempting to bite mitt off.  Pt given 1mg  Ativan IV.  Pt is resting now.   Vital signs remain stable. Will continue to monitor.

## 2018-11-28 NOTE — Progress Notes (Signed)
Per the Charge Nurse unable to have sitter until night shift.

## 2018-11-28 NOTE — Progress Notes (Signed)
Pt tachypnic and tachycardic sustained 120's-gave IV tylenol, let MD know and he advised to give 0.5mg  ativan. Will continue to monitor

## 2018-11-28 NOTE — Progress Notes (Signed)
Spoke with provided contact [place] to provide daily update.

## 2018-11-28 NOTE — Progress Notes (Signed)
PT Cancellation Note  Patient Details Name: Jimmy Burns MRN: [id number] DOB: 20-Nov-1917   Cancelled Treatment:    Reason Eval/Treat Not Completed: Fatigue/lethargy limiting ability to participate  Attempted to evaluate patient along with SLP and OT with pt not improving his level of arousal despite PROM, cool washcloth, repositioning and oral care. Also had interpreter present via [employer]/iPad and he did not arouse to spoken language.  Will attempt when pt more alert.  Jeanie Cooks Adelaide Pfefferkorn, PT 11/28/2018, 11:55 AM

## 2018-11-28 NOTE — Progress Notes (Addendum)
Occupational Therapy Treatment Patient Details Name: Jimmy Burns MRN: [id number] DOB: 1918-04-24 Today's Date: 11/28/2018    History of present illness 85 y.o. Spanish-speaking male with past medical history significant for remote tobacco abuse; who presents with AMS per family. MRI finding acute/subacute nonhemorrhagic infarct including the posterior limb left internal capsuale and left thalamus.    OT comments  Upon arrival, pt supine in bed with SLP at bedside. Pt with poor arousal and keeping his eyes closed throughout session. Attempting to increased arousal with repositioning in bed, cool wash cloth, oral care, and ROM. Pt requiring Total A +2 to reposition into upright position. Requiring hand over hand to participate in oral care and wash his face. Continue to recommend dc to SNF and will continue to follow acutely as admitted.   Stratus interpreter [place] [phone] for [place].      Follow Up Recommendations  SNF    Equipment Recommendations  Other (comment)(Defer to next venue)    Recommendations for Other Services PT consult    Precautions / Restrictions Precautions Precautions: Fall       Mobility Bed Mobility Overal bed mobility: Needs Assistance             General bed mobility comments: Max-Total A for repositioning. Optimizing upright position  Transfers                 General transfer comment: Defered for safety    Balance                                           ADL either performed or assessed with clinical judgement   ADL Overall ADL's : Needs assistance/impaired     Grooming: Oral care;Wash/dry face;Bed level;Total assistance Grooming Details (indicate cue type and reason): Total A for hand over hand to wash his face and perforl oral care. Pt pulling away from suction when placed near his mouth.                                General ADL Comments: Pt with poor arousal and participation. Performing oral  care and washing his face with Total A. Total A for repositioning in bed.      Vision       Perception     Praxis      Cognition Arousal/Alertness: Lethargic Behavior During Therapy: (Poor arousal) Overall Cognitive Status: Difficult to assess                                 General Comments: Pt not opening his eyes or waking to mutiple stimuli. Pt moaning and pulling away when performing oral care or ROM for BUEs        Exercises     Shoulder Instructions       General Comments Spo2 90% on RA    Pertinent Vitals/ Pain       Pain Assessment: Faces Faces Pain Scale: Hurts a little bit Pain Location: Grimacing with extension of RUE Pain Descriptors / Indicators: Grimacing;Discomfort Pain Intervention(s): Monitored during session;Limited activity within patient's tolerance;Repositioned  Home Living  Prior Functioning/Environment              Frequency  Min 3X/week        Progress Toward Goals  OT Goals(current goals can now be found in the care plan section)  Progress towards OT goals: Progressing toward goals  Acute Rehab OT Goals Patient Stated Goal: Unstated OT Goal Formulation: Patient unable to participate in goal setting Time For Goal Achievement: 12/11/18 Potential to Achieve Goals: Good ADL Goals Pt Will Perform Grooming: with min assist;sitting Pt Will Transfer to Toilet: with min assist;stand pivot transfer;bedside commode Additional ADL Goal #1: Pt will perform bed mobility with Min Guard A in preparation for ADLs Additional ADL Goal #2: Pt will sustain attention to simple ADLs with Min cues  Plan Discharge plan remains appropriate    Co-evaluation    PT/OT/SLP Co-Evaluation/Treatment: Yes Reason for Co-Treatment: Other (comment)(SLP)   OT goals addressed during session: Strengthening/ROM;ADL's and self-care      AM-PAC OT "6 Clicks" Daily Activity      Outcome Measure   Help from another person eating meals?: Total Help from another person taking care of personal grooming?: A Lot Help from another person toileting, which includes using toliet, bedpan, or urinal?: A Lot Help from another person bathing (including washing, rinsing, drying)?: Total Help from another person to put on and taking off regular upper body clothing?: A Lot Help from another person to put on and taking off regular lower body clothing?: Total 6 Click Score: 9    End of Session Equipment Utilized During Treatment: Oxygen  OT Visit Diagnosis: Unsteadiness on feet (R26.81);Other abnormalities of gait and mobility (R26.89);Muscle weakness (generalized) (M62.81);Other symptoms and signs involving cognitive function;Hemiplegia and hemiparesis Hemiplegia - Right/Left: Right Hemiplegia - caused by: Cerebral infarction   Activity Tolerance Treatment limited secondary to agitation   Patient Left in bed;with call bell/phone within reach;with nursing/sitter in room;with bed alarm set   Nurse Communication Mobility status        Time: 1034-1050 OT Time Calculation (min): 16 min  Charges: OT General Charges $OT Visit: 1 Visit OT Treatments $Self Care/Home Management : 8-22 mins  Markus Casten MSOT, OTR/L Acute Rehab Pager: [phone] Office: [phone]   Theodoro GristCharis M Wally Behan 11/28/2018, 1:42 PM

## 2018-11-28 NOTE — Progress Notes (Addendum)
PROGRESS NOTE                                                                                                                                                                                                             Patient Demographics:    Jimmy Burns, is a 85 y.o. male, DOB - [birth year], ZOX:[id number]  Outpatient Primary MD for the patient is Patient, No Pcp Per    LOS - 2  Chief Complaint  Patient presents with   Altered Mental Status       Brief Narrative: Patient is a 85 y.o. male with no past medical history-brought in for altered mental status, further work-up revealed right-sided weakness-confirmed to have acute CVA on MRI.  Furthermore COVID-19 also positive.  See below for further details   Subjective:    Jimmy Burns remains lethargic-although opens eyes to sternal rub.  Mumbles incoherently.  Still appears to have significant amount of right-sided weakness.  Appears to have some gurgling sound-likely is accumulating some secretions as well.   Assessment  & Plan :   Acute CVA involving the posterior limb internal capsule and left thalamus: Probably lacunar infarct in the setting of small vessel disease.  Given advanced age-further work-up including echo/carotid Dopplers will likely not change management.  No further recommendations from neurology.  Continues to have significant right-sided deficit-although seems to be able to move his right leg help bit more compared to yesterday-still only able to move his right leg side to side and is not lifting his leg off the bed.  Awaiting evaluation by rehab services including SLP.  If he appears to have significant dysphagia that does not improve then he probably will require hospice care.  In the meantime continue with aspirin suppository.  LDL 59, A1C 6.1.  Delirium: Not sure if patient has a underlying history of dementia-has received several doses of Ativan since  yesterday-have asked nursing staff to minimize use of benzodiazepines as much as possible-have ordered a Recruitment consultant.  COVID-19: Remains on room air-admission chest x-ray negative for infiltrates.  But suspect patient is at risk of aspiration-he appears to be accumulating some secretions this morning.  COVID-19 Labs:  Recent Labs    2018-12-03 1812 11/27/18 0852 11/28/18 0425  DDIMER 1.29* 1.58* 1.59*  FERRITIN 122 135 164  LDH 207*  --  262*  CRP 11.4* 16.0* 10.4*    Lab Results  Component Value Date   SARSCOV2NAA POSITIVE (A) 12/17/2018     COVID-19 Medications: 6/6>>6/7 Solumedrol   Mild transaminitis: Likely secondary to COVID-19-no further work-up required-as very mild-we will follow periodically.  Palliative care: DNR in place-given advanced age-not a candidate for any further escalation in care.  As noted above-further stroke work-up will likely not change outcome of management.  Awaiting evaluation by SLP services-if appears to have significant amount of dysphagia-then will likely require initiation of palliative measures.  Condition - Extremely Guarded  Family Communication  : grand-daughter updated over the phone on 6/8  Code Status :  DNR  Diet :  Diet Order    None       Disposition Plan  :  Remain inpatient  Consults  :Neurology  Procedures  :  None  DVT Prophylaxis  :  Lovenox   Lab Results  Component Value Date   PLT 160 11/28/2018    Inpatient Medications  Scheduled Meds:  aspirin  300 mg Rectal Daily   Or   aspirin  325 mg Oral Daily   enoxaparin (LOVENOX) injection  40 mg Subcutaneous Daily   sodium chloride flush  3 mL Intravenous Q12H   vitamin C  500 mg Oral Daily   zinc sulfate  220 mg Oral Daily   Continuous Infusions:  sodium chloride 75 mL/hr at 11/28/18 0515   sodium chloride Stopped (11/24/2018 1838)   PRN Meds:.albuterol, chlorpheniramine-HYDROcodone, guaiFENesin-dextromethorphan, LORazepam, ondansetron **OR**  ondansetron (ZOFRAN) IV  Antibiotics  :    Anti-infectives (From admission, onward)   None       Time Spent in minutes  35    Jeoffrey MassedShanker Aundra Espin M.D on 11/28/2018 at 12:28 PM  To page go to [email or web address] - use universal password  [facility] -  Office  [phone]   Admit date - 11/29/2018    2    Objective:   Vitals:   11/27/18 1953 11/28/18 0000 11/28/18 0400 11/28/18 1050  BP:  135/73 (!) 145/79 (!) 175/75  Pulse:  68 (!) 48   Resp:  20 (!) 25   Temp: 98.3 F (36.8 C) 97.9 F (36.6 C) 99.7 F (37.6 C) 98.6 F (37 C)  TempSrc:   Axillary Axillary  SpO2:  100% 93%   Weight:      Height:        Wt Readings from Last 3 Encounters:  12/03/2018 63.5 kg     Intake/Output Summary (Last 24 hours) at 11/28/2018 1228 Last data filed at 11/28/2018 1000 Gross per 24 hour  Intake 2036.78 ml  Output --  Net 2036.78 ml     Physical Exam General appearance: Lethargic-arouses to painful stimuli. Eyes:no scleral icterus. HEENT: Atraumatic and Normocephalic Neck: supple, no JVD. Resp:Good air entry bilaterally, some transmitted upper airway sounds CVS: S1 S2 regular, no murmurs.  GI: Bowel sounds present, Non tender and not distended with no gaurding, rigidity or rebound. Extremities: No edema Neurology: Right hemiplegia Musculoskeletal:No digital cyanosis Skin:No Rash, warm and dry Wounds:N/A   Data Review:    CBC Recent Labs  Lab 11/23/2018 1322 11/27/18 0852 11/28/18 0425  WBC 7.5 4.7 10.5  HGB 12.0* 12.1* 13.2  HCT 36.6* 38.3* 40.4  PLT 142* 137* 160  MCV 77.9* 79.5* 79.4*  MCH 25.5* 25.1* 25.9*  MCHC 32.8 31.6 32.7  RDW 15.7* 16.0* 16.1*  LYMPHSABS 0.9 0.4* 1.1  MONOABS 0.7 0.2 0.7  EOSABS 0.2 0.0 0.0  BASOSABS  0.0 0.0 0.0    Chemistries  Recent Labs  Lab 11/28/2018 1322 11/27/18 0852 11/28/18 0425  NA 136 139 145  K 4.2 4.3 4.6  CL 106 108 113*  CO2 23 24 23   GLUCOSE 99 153* 103*  BUN 10 19 22   CREATININE 1.22 1.20 1.08  CALCIUM  8.0* 8.0* 8.4*  MG  --  2.2 2.2  AST 43* 50* 48*  ALT 20 23 22   ALKPHOS 67 64 63  BILITOT 0.8 0.8 0.6   ------------------------------------------------------------------------------------------------------------------ Recent Labs    11/28/18 0425  CHOL 123  HDL 36*  LDLCALC 59  TRIG 161140  CHOLHDL 3.4    Lab Results  Component Value Date   HGBA1C 6.1 (H) 11/27/2018   ------------------------------------------------------------------------------------------------------------------ No results for input(s): TSH, T4TOTAL, T3FREE, THYROIDAB in the last 72 hours.  Invalid input(s): FREET3 ------------------------------------------------------------------------------------------------------------------ Recent Labs    11/27/18 0852 11/28/18 0425  FERRITIN 135 164  TIBC 219*  --   IRON 10*  --     Coagulation profile Recent Labs  Lab 12/19/2018 1322  INR 1.0    Recent Labs    11/27/18 0852 11/28/18 0425  DDIMER 1.58* 1.59*    Cardiac Enzymes Recent Labs  Lab 12/03/2018 1812  TROPONINI 0.03*   ------------------------------------------------------------------------------------------------------------------    Component Value Date/Time   BNP 217.9 (H) 11/28/2018 0425    Micro Results Recent Results (from the past 240 hour(s))  SARS Coronavirus 2 (CEPHEID - Performed in [facility] lab), Hosp Order     Status: Abnormal   Collection Time: 12/13/2018  1:14 PM  Result Value Ref Range Status   SARS Coronavirus 2 POSITIVE (A) NEGATIVE Final    Comment: CRITICAL RESULT CALLED TO, READ BACK BY AND VERIFIED WITH: RN KEVIN B. [id number] FCP (NOTE) If result is NEGATIVE SARS-CoV-2 target nucleic acids are NOT DETECTED. The SARS-CoV-2 RNA is generally detectable in upper and lower  respiratory specimens during the acute phase of infection. The lowest  concentration of SARS-CoV-2 viral copies this assay can detect is 250  copies / mL. A negative result does not  preclude SARS-CoV-2 infection  and should not be used as the sole basis for treatment or other  patient management decisions.  A negative result may occur with  improper specimen collection / handling, submission of specimen other  than nasopharyngeal swab, presence of viral mutation(s) within the  areas targeted by this assay, and inadequate number of viral copies  (<250 copies / mL). A negative result must be combined with clinical  observations, patient history, and epidemiological information. If result is POSITIVE SARS-CoV-2 target nucleic acids are DETECTED.  The SARS-CoV-2 RNA is generally detectable in upper and lower  respiratory specimens during the acute phase of infection.  Positive  results are indicative of active infection with SARS-CoV-2.  Clinical  correlation with patient history and other diagnostic information is  necessary to determine patient infection status.  Positive results do  not rule out bacterial infection or co-infection with other viruses. If result is PRESUMPTIVE POSTIVE SARS-CoV-2 nucleic acids MAY BE PRESENT.   A presumptive positive result was obtained on the submitted specimen  and confirmed on repeat testing.  While 2019 novel coronavirus  (SARS-CoV-2) nucleic acids may be present in the submitted sample  additional confirmatory testing may be necessary for epidemiological  and / or clinical management purposes  to differentiate between  SARS-CoV-2 and other Sarbecovirus currently known to infect humans.  If clinically indicated additional testing with an  alternate test  methodology [id number]) i s advised. The SARS-CoV-2 RNA is generally  detectable in upper and lower respiratory specimens during the acute  phase of infection. The expected result is Negative. Fact Sheet for Patients:  BoilerBrush.com.cyhttps://www.fda.gov/media/136312/download Fact Sheet for Healthcare Providers: https://pope.com/https://www.fda.gov/media/136313/download This test is not yet approved or cleared by  the [place] FDA and has been authorized for detection and/or diagnosis of SARS-CoV-2 by FDA under an Emergency Use Authorization (EUA).  This EUA will remain in effect (meaning this test can be used) for the duration of the COVID-19 declaration under Section 564(b)(1) of the Act, 21 U.S.C. section 360bbb-3(b)(1), unless the authorization is terminated or revoked sooner. Performed at [facility], [address] [place], [place] [address]   Culture, blood (Routine x 2)     Status: None (Preliminary result)   Collection Time: 12/12/2018  1:22 PM  Result Value Ref Range Status   Specimen Description BLOOD LEFT ARM  Final   Special Requests   Final    BOTTLES DRAWN AEROBIC AND ANAEROBIC Blood Culture adequate volume   Culture   Final    NO GROWTH 2 DAYS Performed at [facility], [address] [place], [place] [address]    Report Status PENDING  Incomplete  Urine culture     Status: None   Collection Time: 12/13/2018  1:26 PM  Result Value Ref Range Status   Specimen Description URINE, RANDOM  Final   Special Requests NONE  Final   Culture   Final    NO GROWTH Performed at [facility], [address] [place], [place] [address]    Report Status 11/27/2018 FINAL  Final  Culture, blood (Routine x 2)     Status: None (Preliminary result)   Collection Time: 11/21/2018  2:16 PM  Result Value Ref Range Status   Specimen Description BLOOD LEFT FOREARM  Final   Special Requests AEROBIC BOTTLE ONLY Blood Culture adequate volume  Final   Culture   Final    NO GROWTH 2 DAYS Performed at [facility], [address] [place], [place] [address]    Report Status PENDING  Incomplete    Radiology Reports Ct Head Wo Contrast  Result Date: 12/13/2018 CLINICAL DATA:  Altered mental status. EXAM: CT HEAD WITHOUT CONTRAST TECHNIQUE: Contiguous axial images were obtained from the base of the skull through the vertex without intravenous contrast. COMPARISON:  None.  FINDINGS: Brain: Expected cerebral and cerebellar volume loss for age. Moderate low density in the periventricular white matter likely related to small vessel disease. No mass lesion, hemorrhage, hydrocephalus, acute infarct, intra-axial, or extra-axial fluid collection. Vascular: Intracranial atherosclerosis. Skull: Normal Sinuses/Orbits: Surgical changes about both globes. Left maxillary sinus mucous retention cyst or polyp. Clear mastoid air cells. Other: None. IMPRESSION: Normal head CT for age. No explanation for altered mental status. Cerebral atrophy and small vessel ischemic change. Electronically Signed   By: Jeronimo GreavesKyle  Talbot M.D.   On: 12/20/2018 15:52   Mr Brain Wo Contrast  Result Date: 12/07/2018 CLINICAL DATA:  Altered mental status. Patient last seen well at 9 p.m. last night. Patient did not awake is usual. Fever. EXAM: MRI HEAD WITHOUT CONTRAST TECHNIQUE: Multiplanar, multiecho pulse sequences of the brain and surrounding structures were obtained without intravenous contrast. COMPARISON:  CT head without contrast 12/20/2018 FINDINGS: Brain: The diffusion-weighted images demonstrate acute nonhemorrhagic infarct involving the posterior limb left internal capsule and lateral left thalamus. Moderate generalized atrophy is present. Confluent  periventricular white matter disease is present. A remote lacunar infarct is present in the left centrum semi ovale. No blood products are evident. The ventricles are proportionate to the degree of atrophy. The internal auditory canals are within normal limits. The brainstem is normal. Remote left PICA territory infarcts are present. Small remote lacunar infarcts are present the posterior cerebellum bilaterally separate from the PICA territory infarct. Vascular: Flow is present in the major intracranial arteries. Skull and upper cervical spine: The craniocervical junction is normal. Upper cervical spine is within normal limits. Marrow signal is unremarkable.  Sinuses/Orbits: Chronic mucosal thickening is present in the left maxillary sinus without a fluid level. The paranasal sinuses and mastoid air cells are otherwise clear. The globes and orbits are within normal limits. IMPRESSION: 1. Acute/subacute nonhemorrhagic infarct involving the posterior limb left internal capsule and left thalamus. The infarct measures 2.5 x 1.2 x 1.5 cm. 2. Atrophy and white matter disease suggests underlying microvascular ischemia. 3. Remote nonhemorrhagic left PICA territory infarcts. 4. Remote punctate infarct of the left centrum semi ovale. Electronically Signed   By: San Morelle M.D.   On: 20-Dec-2018 22:46   Dg Chest Portable 1 View  Result Date: Dec 20, 2018 CLINICAL DATA:  Unresponsive.  Altered mental status. EXAM: PORTABLE CHEST 1 VIEW COMPARISON:  None. FINDINGS: Lungs are adequately inflated without focal airspace consolidation or effusion. No pneumothorax. Cardiomediastinal silhouette is within normal. Mild degenerate change of the spine. IMPRESSION: No active disease. Electronically Signed   By: Marin Olp M.D.   On: 12-20-2018 15:15

## 2018-11-28 NOTE — Evaluation (Signed)
Clinical/Bedside Swallow Evaluation Patient Details  Name: Jimmy Burns MRN: [id number] Date of Birth: Mar 02, 1918  Today's Date: 11/28/2018 Time: SLP Start Time (ACUTE ONLY): 6644 SLP Stop Time (ACUTE ONLY): 1150 SLP Time Calculation (min) (ACUTE ONLY): 14 min  Past Medical History: History reviewed. No pertinent past medical history. Past Surgical History: History reviewed. No pertinent surgical history. HPI:  85 y.o. Spanish-speaking male with past medical history significant for remote tobacco abuse presents from home with AMS per family. MRI finding acute/subacute nonhemorrhagic infarct including the posterior limb left internal capsuale and left thalamus.  Pt COVID +, transferred from [facility] to [place].  Aphasic.     Assessment / Plan / Recommendation Clinical Impression  Pt seen with PT/OT in an effort to facilitate arousal/participation.  [employer] interpretor, Jimmy Burns) assisted with communication.  [place] oral suctioning set-up.  Pt repositioned per OT/PT.  Pt responded to oral care by sealing lips and turning head from side-to-side. He opened mouth and accepted sponge when facilitated to hold it with hand-over-hand total assist.     There were no volitional attempts to vocalize, only groaning in response to movements.  Eyes were maintained closed.  Pt did not follow commands when prompted by interpretor.  RR  remained in low-mid 30s throughout session; Sp02 dropped to mid 80s then increased to low 90s.  He remains on room air. There were no spontaneous swallows observed during oral care, only wet coughing.  Given current status, it was not safe to offer POs.  Pt should remain NPO; please provide oral care QD.  Spoke with granddaughter, Jimmy Burns, to inform her of today's efforts to assess swallow.  She verbalized understanding. SLP will follow for plan of care, potential improvements.  SLP Visit Diagnosis: Dysphagia, unspecified (R13.10)    Aspiration Risk  Severe aspiration risk    Diet  Recommendation          Other  Recommendations Oral Care Recommendations: Oral care QID   Follow up Recommendations Other (comment)(tba)      Frequency and Duration min 3x week  1 week       Prognosis Prognosis for Safe Diet Advancement: Guarded      Swallow Study   General Date of Onset: 12/05/18 HPI: 85 y.o. Spanish-speaking male with past medical history significant for remote tobacco abuse presents from home with AMS per family. MRI finding acute/subacute nonhemorrhagic infarct including the posterior limb left internal capsuale and left thalamus.  Pt COVID +, transferred from [facility] to [place].  Aphasic.   Type of Study: Bedside Swallow Evaluation Diet Prior to this Study: NPO Temperature Spikes Noted: No Respiratory Status: Room air History of Recent Intubation: No Behavior/Cognition: Lethargic/Drowsy Oral Cavity Assessment: Dry Oral Care Completed by SLP: Yes Oral Cavity - Dentition: Missing dentition Self-Feeding Abilities: Other (Comment)(unable) Patient Positioning: Upright in bed Baseline Vocal Quality: Other (comment)(no intentional voicing) Volitional Cough: Cognitively unable to elicit Volitional Swallow: Unable to elicit    Oral/Motor/Sensory Function Overall Oral Motor/Sensory Function: Other (comment)(right lower face asymmetry)   Ice Chips Ice chips: Not tested   Thin Liquid Thin Liquid: Not tested    Nectar Thick Nectar Thick Liquid: Not tested   Honey Thick Honey Thick Liquid: Not tested   Puree Puree: Not tested   Solid    Jimmy Burns L. Tivis Ringer, MA CCC/SLP Acute Rehabilitation Services Office number [phone] Pager [phone]  Solid: Not tested      Jimmy Burns 11/28/2018,1:33 PM

## 2018-11-28 NOTE — Progress Notes (Signed)
Spoke with patients granddaughter and updated her as well as answered questions

## 2018-11-28 NOTE — Progress Notes (Signed)
Initial Nutrition Assessment  DOCUMENTATION CODES:   Not applicable  INTERVENTION:   Support goals of care.  NUTRITION DIAGNOSIS:   Increased nutrient needs related to acute illness(covid) as evidenced by estimated needs.  GOAL:   Patient will meet greater than or equal to 90% of their needs  MONITOR:   PO intake, Supplement acceptance  REASON FOR ASSESSMENT:   Malnutrition Screening Tool    ASSESSMENT:   Pt with PMH of remote tobacco abuse who is Spanish speaking and lives at home. Pt now admitted with acute CVA and was COVID-19 positive.    Per MD no further work up for stroke at this time. Per therapy pt is lethargic and unable to participate in therapies. Pt remains NPO. Pt on room air.   Medications reviewed and include: 500 mg Vitamin C, 220 mg Zinc daily  Labs reviewed: CRP: 10.4 (H)    NUTRITION - FOCUSED PHYSICAL EXAM:  Deferred   Diet Order:   Diet Order    None      EDUCATION NEEDS:   No education needs have been identified at this time  Skin:  Skin Assessment: (R toe amp)  Last BM:  6/8  Height:   Ht Readings from Last 1 Encounters:  12/09/2018 5\' 10"  (1.778 m)    Weight:   Wt Readings from Last 1 Encounters:  11/24/2018 63.5 kg    Ideal Body Weight:  75.4 kg  BMI:  Body mass index is 20.09 kg/m.  Estimated Nutritional Needs:   Kcal:  1600-1800  Protein:  80-100 grams  Fluid:  > 1.6 L/day  Maylon Peppers RD, LDN, CNSC [phone] Pager [phone] After Hours Pager

## 2018-11-29 ENCOUNTER — Inpatient Hospital Stay (HOSPITAL_COMMUNITY): Payer: Medicaid Other

## 2018-11-29 ENCOUNTER — Other Ambulatory Visit: Payer: Self-pay

## 2018-11-29 LAB — CBC WITH DIFFERENTIAL/PLATELET
Abs Immature Granulocytes: 0.1 10*3/uL — ABNORMAL HIGH (ref 0.00–0.07)
Basophils Absolute: 0 10*3/uL (ref 0.0–0.1)
Basophils Relative: 0 %
Eosinophils Absolute: 0.1 10*3/uL (ref 0.0–0.5)
Eosinophils Relative: 0 %
HCT: 38.7 % — ABNORMAL LOW (ref 39.0–52.0)
Hemoglobin: 12.3 g/dL — ABNORMAL LOW (ref 13.0–17.0)
Immature Granulocytes: 1 %
Lymphocytes Relative: 3 %
Lymphs Abs: 0.5 10*3/uL — ABNORMAL LOW (ref 0.7–4.0)
MCH: 25.5 pg — ABNORMAL LOW (ref 26.0–34.0)
MCHC: 31.8 g/dL (ref 30.0–36.0)
MCV: 80.1 fL (ref 80.0–100.0)
Monocytes Absolute: 0.7 10*3/uL (ref 0.1–1.0)
Monocytes Relative: 4 %
Neutro Abs: 14.8 10*3/uL — ABNORMAL HIGH (ref 1.7–7.7)
Neutrophils Relative %: 92 %
Platelets: 187 10*3/uL (ref 150–400)
RBC: 4.83 MIL/uL (ref 4.22–5.81)
RDW: 16.1 % — ABNORMAL HIGH (ref 11.5–15.5)
WBC Morphology: INCREASED
WBC: 16.1 10*3/uL — ABNORMAL HIGH (ref 4.0–10.5)
nRBC: 0 % (ref 0.0–0.2)

## 2018-11-29 LAB — MAGNESIUM: Magnesium: 2 mg/dL (ref 1.7–2.4)

## 2018-11-29 LAB — COMPREHENSIVE METABOLIC PANEL
ALT: 19 U/L (ref 0–44)
AST: 38 U/L (ref 15–41)
Albumin: 2.7 g/dL — ABNORMAL LOW (ref 3.5–5.0)
Alkaline Phosphatase: 60 U/L (ref 38–126)
Anion gap: 12 (ref 5–15)
BUN: 23 mg/dL (ref 8–23)
CO2: 19 mmol/L — ABNORMAL LOW (ref 22–32)
Calcium: 7.7 mg/dL — ABNORMAL LOW (ref 8.9–10.3)
Chloride: 114 mmol/L — ABNORMAL HIGH (ref 98–111)
Creatinine, Ser: 1.62 mg/dL — ABNORMAL HIGH (ref 0.61–1.24)
GFR calc Af Amer: 40 mL/min — ABNORMAL LOW (ref >60)
GFR calc non Af Amer: 35 mL/min — ABNORMAL LOW (ref >60)
Glucose, Bld: 88 mg/dL (ref 70–99)
Potassium: 3.7 mmol/L (ref 3.5–5.1)
Sodium: 145 mmol/L (ref 135–145)
Total Bilirubin: 1.1 mg/dL (ref 0.3–1.2)
Total Protein: 6.1 g/dL — ABNORMAL LOW (ref 6.5–8.1)

## 2018-11-29 LAB — D-DIMER, QUANTITATIVE: D-Dimer, Quant: 1.3 ug/mL-FEU — ABNORMAL HIGH (ref 0.00–0.50)

## 2018-11-29 LAB — FERRITIN: Ferritin: 148 ng/mL (ref 24–336)

## 2018-11-29 LAB — C-REACTIVE PROTEIN: CRP: 17.6 mg/dL — ABNORMAL HIGH (ref <1.0)

## 2018-11-29 LAB — CK: Total CK: 211 U/L (ref 49–397)

## 2018-11-29 LAB — BRAIN NATRIURETIC PEPTIDE: B Natriuretic Peptide: 136.7 pg/mL — ABNORMAL HIGH (ref 0.0–100.0)

## 2018-11-29 MED ORDER — HALOPERIDOL LACTATE 2 MG/ML PO CONC
0.5000 mg | ORAL | Status: DC | PRN
  Filled 2018-11-29: qty 0.3

## 2018-11-29 MED ORDER — SODIUM CHLORIDE 0.9% FLUSH: 3.0000 mL | INTRAVENOUS | Status: DC | PRN

## 2018-11-29 MED ORDER — ONDANSETRON 4 MG PO TBDP: 4.0000 mg | ORAL_TABLET | Freq: Four times a day (QID) | ORAL | Status: DC | PRN

## 2018-11-29 MED ORDER — LORAZEPAM 2 MG/ML IJ SOLN
1.0000 mg | INTRAMUSCULAR | Status: DC | PRN
  Administered 2018-11-29 – 2018-11-30 (×2): 1 mg via INTRAVENOUS
  Filled 2018-11-29 (×2): qty 1

## 2018-11-29 MED ORDER — LORAZEPAM 2 MG/ML PO CONC
1.0000 mg | ORAL | Status: DC | PRN
  Filled 2018-11-29: qty 0.5

## 2018-11-29 MED ORDER — ACETAMINOPHEN 325 MG PO TABS: 650.0000 mg | ORAL_TABLET | Freq: Four times a day (QID) | ORAL | Status: DC | PRN

## 2018-11-29 MED ORDER — MORPHINE BOLUS VIA INFUSION
1.0000 mg | INTRAVENOUS | Status: DC | PRN
  Administered 2018-11-29: 1 mg via INTRAVENOUS
  Administered 2018-11-29 – 2018-11-30 (×5): 2 mg via INTRAVENOUS
  Filled 2018-11-29: qty 2

## 2018-11-29 MED ORDER — ONDANSETRON HCL 4 MG/2ML IJ SOLN: 4.0000 mg | Freq: Four times a day (QID) | INTRAMUSCULAR | Status: DC | PRN

## 2018-11-29 MED ORDER — GLYCOPYRROLATE 1 MG PO TABS
1.0000 mg | ORAL_TABLET | ORAL | Status: DC | PRN
  Filled 2018-11-29: qty 1

## 2018-11-29 MED ORDER — HALOPERIDOL 0.5 MG PO TABS
0.5000 mg | ORAL_TABLET | ORAL | Status: DC | PRN
  Filled 2018-11-29: qty 1

## 2018-11-29 MED ORDER — POLYVINYL ALCOHOL 1.4 % OP SOLN
1.0000 [drp] | Freq: Four times a day (QID) | OPHTHALMIC | Status: DC | PRN
  Filled 2018-11-29: qty 15

## 2018-11-29 MED ORDER — GLYCOPYRROLATE 0.2 MG/ML IJ SOLN
0.2000 mg | INTRAMUSCULAR | Status: DC | PRN
  Administered 2018-11-29 – 2018-11-30 (×2): 0.2 mg via INTRAVENOUS
  Filled 2018-11-29 (×2): qty 1

## 2018-11-29 MED ORDER — HALOPERIDOL LACTATE 5 MG/ML IJ SOLN: 0.5000 mg | INTRAMUSCULAR | Status: DC | PRN

## 2018-11-29 MED ORDER — ACETAMINOPHEN 650 MG RE SUPP: 650.0000 mg | Freq: Four times a day (QID) | RECTAL | Status: DC | PRN

## 2018-11-29 MED ORDER — SODIUM CHLORIDE 0.9 % IV SOLN
INTRAVENOUS | Status: DC
  Administered 2018-11-29: 20:00:00 via INTRAVENOUS

## 2018-11-29 MED ORDER — GLYCOPYRROLATE 0.2 MG/ML IJ SOLN: 0.2000 mg | INTRAMUSCULAR | Status: DC | PRN

## 2018-11-29 MED ORDER — SODIUM CHLORIDE 0.9% FLUSH
3.0000 mL | Freq: Two times a day (BID) | INTRAVENOUS | Status: DC
  Administered 2018-11-29: 3 mL via INTRAVENOUS

## 2018-11-29 MED ORDER — MORPHINE 100MG IN NS 100ML (1MG/ML) PREMIX INFUSION
2.0000 mg/h | INTRAVENOUS | Status: DC
  Administered 2018-11-29: 2 mg/h via INTRAVENOUS
  Filled 2018-11-29 (×2): qty 100

## 2018-11-29 MED ORDER — DIPHENHYDRAMINE HCL 50 MG/ML IJ SOLN
12.5000 mg | INTRAMUSCULAR | Status: DC | PRN
  Administered 2018-11-30: 12.5 mg via INTRAVENOUS

## 2018-11-29 MED ORDER — LORAZEPAM 0.5 MG PO TABS: 1.0000 mg | ORAL_TABLET | ORAL | Status: DC | PRN

## 2018-11-29 MED ORDER — SODIUM CHLORIDE 0.9 % IV SOLN: 250.0000 mL | INTRAVENOUS | Status: DC | PRN

## 2018-11-29 NOTE — Progress Notes (Addendum)
PROGRESS NOTE                                                                                                                                                                                                             Patient Demographics:    Jimmy Burns, is a 85 y.o. male, DOB - December 12, 1917, ZOX:[id number]  Outpatient Primary MD for the patient is Patient, No Pcp Per    LOS - 3  Chief Complaint  Patient presents with   Altered Mental Status       Brief Narrative: Patient is a 85 y.o. male with no past medical history-brought in for altered mental status, further work-up revealed right-sided weakness-confirmed to have acute CVA on MRI.  Furthermore COVID-19 also positive.  Unfortunately-continue to have persistent right-sided hemiparesis and dysphagia during this hospital stay-and then subsequently developed worsening hypoxia due to aspiration pneumonia.  See below for further details   Subjective:    Jimmy Burns remains barely responsive.  Gurgling-became tachypneic and tachycardic overnight.  Chest x-ray confirms pneumonia.  Speech therapy eval completed yesterday-appears to have severe dysphagia.   Assessment  & Plan :   Acute CVA involving the posterior limb internal capsule and left thalamus: Probably lacunar infarct in the setting of small vessel disease.  Given advanced age-further work-up including echo/carotid Dopplers will likely not change management.  No further recommendations from neurology.  Unfortunately-continues to have significant right-sided deficits and appears to have severe dysphagia from CVA.  No improvement since admission-in fact has worsened-and has developed respiratory failure from aspiration pneumonia.  I spoke with patient's granddaughter, 2 daughter's (including 1 from [place]) over the phone utilizing translator services-I spent approximately 1 hour explaining to the family the overall poor  prognosis and the fact that patient has deteriorated and is probably closer to passing away than just yesterday.  Family is very well aware that patient is not a candidate for any escalation in care-and that due to continued deterioration-and the fact that patient is aspirating his own secretions-transitioning to comfort care is more than appropriate.  After extensive discussion-family is in agreement with this plan-have started morphine infusion-and full comfort measures.  Stop aspirin.   Acute hypoxic respiratory failure secondary to aspiration pneumonia: See above-transitioning to full comfort services.  AKI: Creatinine worsening today-probably hemodynamically mediated in the setting of poor oral intake.  See above-transition to full comfort measures-we will not check any further laboratory data.  Delirium: Mostly unresponsive/lethargic-at times does get agitated-and has required PRN Ativan.  Comfort care measures now in effect-see above  COVID-19: Remains on room air-admission chest x-ray negative for infiltrates-however now seems to have aspirated and has developed aspiration pneumonia.  Transition to full comfort measures.  Mild transaminitis: Likely secondary to COVID-19-no further work-up required-as very mild-we will follow periodically.  COVID-19 Labs:  Recent Labs    11/27/2018 1812 11/27/18 0852 11/28/18 0425 11/29/18 0319  DDIMER 1.29* 1.58* 1.59* 1.30*  FERRITIN 122 135 164 148  LDH 207*  --  262*  --   CRP 11.4* 16.0* 10.4* 17.6*    Lab Results  Component Value Date   SARSCOV2NAA POSITIVE (A) 12/10/2018     COVID-19 Medications: 6/6>>6/7 Solumedrol   Palliative care: DNR in place-given advanced age-not a candidate for any further escalation in care.  Continues to have significant right-sided hemiplegia, appears to have severe dysphagia-and now has developed respiratory failure probably secondary to aspiration pneumonia.  Extensive discussion with granddaughter and 2  daughters over the phone utilizing [employer] translator services (more than 1 hour spent)-family aware of very poor prognosis-and that the patient is actively dying due to ongoing overt aspiration.  After extensive discussion-patient has now been transitioned to full comfort measures-initiating morphine infusion.  Family will discuss with RN staff to see if visitation services can be arranged.  Condition -unstable-actively dying  Family Communication  : grand-daughter/daughters updated over the phone via translator services on 6/9.   Code Status :  DNR  Diet :  Diet Order    None       Disposition Plan  :  Remain inpatient-expect inpatient death  Consults  :Neurology  Procedures  :  None  DVT Prophylaxis  : Discontinue Lovenox  Lab Results  Component Value Date   PLT 187 11/29/2018    Inpatient Medications  Scheduled Meds:  aspirin  300 mg Rectal Daily   Or   aspirin  325 mg Oral Daily   chlorhexidine  15 mL Mouth Rinse BID   enoxaparin (LOVENOX) injection  40 mg Subcutaneous Daily   mouth rinse  15 mL Mouth Rinse q12n4p   sodium chloride flush  3 mL Intravenous Q12H   vitamin C  500 mg Oral Daily   zinc sulfate  220 mg Oral Daily   Continuous Infusions:  sodium chloride 75 mL/hr at 11/29/18 1027   acetaminophen 1,000 mg (11/28/18 2144)   sodium chloride Stopped (12/16/2018 1838)   PRN Meds:.acetaminophen, albuterol, chlorpheniramine-HYDROcodone, guaiFENesin-dextromethorphan, hydrALAZINE, LORazepam, ondansetron **OR** ondansetron (ZOFRAN) IV  Antibiotics  :    Anti-infectives (From admission, onward)   None       Time Spent in minutes  35    [name] M.D on 11/29/2018 at 11:06 AM  To page go to [email or web address] - use universal password  [facility] -  Office  [phone]   Admit date - 12/11/2018    3    Objective:   Vitals:   11/29/18 0800 11/29/18 0900 11/29/18 1000 11/29/18 1031  BP: (!) 106/55 (!) 126/57 100/60   Pulse: 74 79  (!) 32   Resp: (!) 31 (!) 29 (!) 25   Temp: 98.9 F (37.2 C)   97.7 F (36.5 C)  TempSrc: Axillary     SpO2: 96% (!) 26% 92%   Weight:      Height:        Wt Readings  from Last 3 Encounters:  12/01/2018 63.5 kg     Intake/Output Summary (Last 24 hours) at 11/29/2018 1106 Last data filed at 11/29/2018 0600 Gross per 24 hour  Intake 1529.13 ml  Output 300 ml  Net 1229.13 ml     Physical Exam General appearance:-Does not awake to sternal rub-very lethargic Eyes:no scleral icterus. HEENT: Atraumatic and Normocephalic Neck: supple, no JVD. Resp:Good air entry bilaterally, transmitted upper airway sounds CVS: S1 S2 regular, no murmurs.  GI: Bowel sounds present, Non tender and not distended with no gaurding, rigidity or rebound. Extremities: B/L Lower Ext shows no edema, both legs are warm to touch Neurology: Difficult exam-but has significant right-sided weakness. Musculoskeletal:No digital cyanosis Skin:No Rash, warm and dry Wounds:N/A   Data Review:    CBC Recent Labs  Lab 11/29/2018 1322 11/27/18 0852 11/28/18 0425 11/29/18 0319  WBC 7.5 4.7 10.5 16.1*  HGB 12.0* 12.1* 13.2 12.3*  HCT 36.6* 38.3* 40.4 38.7*  PLT 142* 137* 160 187  MCV 77.9* 79.5* 79.4* 80.1  MCH 25.5* 25.1* 25.9* 25.5*  MCHC 32.8 31.6 32.7 31.8  RDW 15.7* 16.0* 16.1* 16.1*  LYMPHSABS 0.9 0.4* 1.1 0.5*  MONOABS 0.7 0.2 0.7 0.7  EOSABS 0.2 0.0 0.0 0.1  BASOSABS 0.0 0.0 0.0 0.0    Chemistries  Recent Labs  Lab 11/25/2018 1322 11/27/18 0852 11/28/18 0425 11/29/18 0319  NA 136 139 145 145  K 4.2 4.3 4.6 3.7  CL 106 108 113* 114*  CO2 23 24 23  19*  GLUCOSE 99 153* 103* 88  BUN 10 19 22 23   CREATININE 1.22 1.20 1.08 1.62*  CALCIUM 8.0* 8.0* 8.4* 7.7*  MG  --  2.2 2.2 2.0  AST 43* 50* 48* 38  ALT 20 23 22 19   ALKPHOS 67 64 63 60  BILITOT 0.8 0.8 0.6 1.1   ------------------------------------------------------------------------------------------------------------------ Recent Labs     11/28/18 0425  CHOL 123  HDL 36*  LDLCALC 59  TRIG 403140  CHOLHDL 3.4    Lab Results  Component Value Date   HGBA1C 6.1 (H) 11/27/2018   ------------------------------------------------------------------------------------------------------------------ No results for input(s): TSH, T4TOTAL, T3FREE, THYROIDAB in the last 72 hours.  Invalid input(s): FREET3 ------------------------------------------------------------------------------------------------------------------ Recent Labs    11/27/18 0852 11/28/18 0425 11/29/18 0319  FERRITIN 135 164 148  TIBC 219*  --   --   IRON 10*  --   --     Coagulation profile Recent Labs  Lab 11/25/2018 1322  INR 1.0    Recent Labs    11/28/18 0425 11/29/18 0319  DDIMER 1.59* 1.30*    Cardiac Enzymes Recent Labs  Lab 11/22/2018 1812  TROPONINI 0.03*   ------------------------------------------------------------------------------------------------------------------    Component Value Date/Time   BNP 136.7 (H) 11/29/2018 0319    Micro Results Recent Results (from the past 240 hour(s))  SARS Coronavirus 2 (CEPHEID - Performed [facility]), Hosp Order     Status: Abnormal   Collection Time: 12/07/2018  1:14 PM  Result Value Ref Range Status   SARS Coronavirus 2 POSITIVE (A) NEGATIVE Final    Comment: CRITICAL RESULT CALLED TO, READ BACK BY AND VERIFIED WITH: RN KEVIN B. [phone] FCP (NOTE) If result is NEGATIVE SARS-CoV-2 target nucleic acids are NOT DETECTED. The SARS-CoV-2 RNA is generally detectable in upper and lower  respiratory specimens during the acute phase of infection. The lowest  concentration of SARS-CoV-2 viral copies this assay can detect is 250  copies / mL. A negative result does not  preclude SARS-CoV-2 infection  and should not be used as the sole basis for treatment or other  patient management decisions.  A negative result may occur with  improper specimen collection / handling,  submission of specimen other  than nasopharyngeal swab, presence of viral mutation(s) within the  areas targeted by this assay, and inadequate number of viral copies  (<250 copies / mL). A negative result must be combined with clinical  observations, patient history, and epidemiological information. If result is POSITIVE SARS-CoV-2 target nucleic acids are DETECTED.  The SARS-CoV-2 RNA is generally detectable in upper and lower  respiratory specimens during the acute phase of infection.  Positive  results are indicative of active infection with SARS-CoV-2.  Clinical  correlation with patient history and other diagnostic information is  necessary to determine patient infection status.  Positive results do  not rule out bacterial infection or co-infection with other viruses. If result is PRESUMPTIVE POSTIVE SARS-CoV-2 nucleic acids MAY BE PRESENT.   A presumptive positive result was obtained on the submitted specimen  and confirmed on repeat testing.  While 2019 novel coronavirus  (SARS-CoV-2) nucleic acids may be present in the submitted sample  additional confirmatory testing may be necessary for epidemiological  and / or clinical management purposes  to differentiate between  SARS-CoV-2 and other Sarbecovirus currently known to infect humans.  If clinically indicated additional testing with an alternate test  methodology [phone]) i s advised. The SARS-CoV-2 RNA is generally  detectable in upper and lower respiratory specimens during the acute  phase of infection. The expected result is Negative. Fact Sheet for Patients:  StrictlyIdeas.no Fact Sheet for Healthcare Providers: BankingDealers.co.za This test is not yet approved or cleared by the [place] FDA and has been authorized for detection and/or diagnosis of SARS-CoV-2 by FDA under an Emergency Use Authorization (EUA).  This EUA will remain in effect (meaning this test can be  used) for the duration of the COVID-19 declaration under Section 564(b)(1) of the Act, 21 U.S.C. section 360bbb-3(b)(1), unless the authorization is terminated or revoked sooner. Performed at [facility], [place] [address], [place], [place] [address]   Culture, blood (Routine x 2)     Status: None (Preliminary result)   Collection Time: 12/03/2018  1:22 PM  Result Value Ref Range Status   Specimen Description BLOOD LEFT ARM  Final   Special Requests   Final    BOTTLES DRAWN AEROBIC AND ANAEROBIC Blood Culture adequate volume   Culture   Final    NO GROWTH 2 DAYS Performed at [facility], [address], [place], [place] [address]    Report Status PENDING  Incomplete  Urine culture     Status: None   Collection Time: 12/12/2018  1:26 PM  Result Value Ref Range Status   Specimen Description URINE, RANDOM  Final   Special Requests NONE  Final   Culture   Final    NO GROWTH Performed at [facility], [address] [place], [place] [address]    Report Status 11/27/2018 FINAL  Final  Culture, blood (Routine x 2)     Status: None (Preliminary result)   Collection Time: 11/23/2018  2:16 PM  Result Value Ref Range Status   Specimen Description BLOOD LEFT FOREARM  Final   Special Requests AEROBIC BOTTLE ONLY Blood Culture adequate volume  Final   Culture   Final    NO GROWTH 2 DAYS Performed at [facility], [place]  [address] [place], [place] [id number]    Report Status PENDING  Incomplete    Radiology Reports Ct Head Wo Contrast  Result Date: 12/14/2018 CLINICAL DATA:  Altered mental status. EXAM: CT HEAD WITHOUT CONTRAST TECHNIQUE: Contiguous axial images were obtained from the base of the skull through the vertex without intravenous contrast. COMPARISON:  None. FINDINGS: Brain: Expected cerebral and cerebellar volume loss for age. Moderate low density in the periventricular white matter likely related to small vessel disease. No mass lesion, hemorrhage,  hydrocephalus, acute infarct, intra-axial, or extra-axial fluid collection. Vascular: Intracranial atherosclerosis. Skull: Normal Sinuses/Orbits: Surgical changes about both globes. Left maxillary sinus mucous retention cyst or polyp. Clear mastoid air cells. Other: None. IMPRESSION: Normal head CT for age. No explanation for altered mental status. Cerebral atrophy and small vessel ischemic change. Electronically Signed   By: Jeronimo GreavesKyle  Talbot M.D.   On: 12-05-2018 15:52   Mr Brain Wo Contrast  Result Date: 11/24/2018 CLINICAL DATA:  Altered mental status. Patient last seen well at 9 p.m. last night. Patient did not awake is usual. Fever. EXAM: MRI HEAD WITHOUT CONTRAST TECHNIQUE: Multiplanar, multiecho pulse sequences of the brain and surrounding structures were obtained without intravenous contrast. COMPARISON:  CT head without contrast 12-05-2018 FINDINGS: Brain: The diffusion-weighted images demonstrate acute nonhemorrhagic infarct involving the posterior limb left internal capsule and lateral left thalamus. Moderate generalized atrophy is present. Confluent periventricular white matter disease is present. A remote lacunar infarct is present in the left centrum semi ovale. No blood products are evident. The ventricles are proportionate to the degree of atrophy. The internal auditory canals are within normal limits. The brainstem is normal. Remote left PICA territory infarcts are present. Small remote lacunar infarcts are present the posterior cerebellum bilaterally separate from the PICA territory infarct. Vascular: Flow is present in the major intracranial arteries. Skull and upper cervical spine: The craniocervical junction is normal. Upper cervical spine is within normal limits. Marrow signal is unremarkable. Sinuses/Orbits: Chronic mucosal thickening is present in the left maxillary sinus without a fluid level. The paranasal sinuses and mastoid air cells are otherwise clear. The globes and orbits are within  normal limits. IMPRESSION: 1. Acute/subacute nonhemorrhagic infarct involving the posterior limb left internal capsule and left thalamus. The infarct measures 2.5 x 1.2 x 1.5 cm. 2. Atrophy and white matter disease suggests underlying microvascular ischemia. 3. Remote nonhemorrhagic left PICA territory infarcts. 4. Remote punctate infarct of the left centrum semi ovale. Electronically Signed   By: Marin Robertshristopher  Mattern M.D.   On: 12-05-2018 22:46   Dg Chest Portable 1 View  Result Date: 12/03/2018 CLINICAL DATA:  Unresponsive.  Altered mental status. EXAM: PORTABLE CHEST 1 VIEW COMPARISON:  None. FINDINGS: Lungs are adequately inflated without focal airspace consolidation or effusion. No pneumothorax. Cardiomediastinal silhouette is within normal. Mild degenerate change of the spine. IMPRESSION: No active disease. Electronically Signed   By: Elberta Fortisaniel  Boyle M.D.   On: 12-05-2018 15:15   Dg Chest Port 1v Same Day  Result Date: 11/29/2018 CLINICAL DATA:  Shortness of breath EXAM: PORTABLE CHEST 1 VIEW COMPARISON:  12-05-2018 FINDINGS: Mild elevation of the right hemidiaphragm. Increasing right basilar atelectasis or infiltrate. Left lung clear. Heart is normal size. IMPRESSION: Increasing right basilar airspace opacity which could reflect atelectasis or pneumonia. Electronically Signed   By: Charlett NoseKevin  Dover M.D.   On: 11/29/2018 08:47

## 2018-11-29 NOTE — Progress Notes (Signed)
Comfort measures continues. On morphine drip at 2 mg/hr. Vitals signs remain stable (see flow sheet). Afebrile. Withdraws to painful stimuli. On 4 liters nasal canula. Spoke with grand daughter Minus Breeding over the phone. Minus Breeding stated "I will call you back tomorrow around 10 am and we will come see him by 12pm". Family educated on the importance of visitation policy. [place] daughter verbalizes an understanding of the visitation policy.

## 2018-11-29 NOTE — Progress Notes (Signed)
Patient comfort measures only. Morphine gtt running at 2mg /hr. Respiratory and heart rates elevated. Gave 2 prn bolus of morphine, 1mg  ativan, and robinul for secretions. Respiratory effort decreased and appears more comfortable. HR does remain elevated in 140s. On call provider paged and does not want to make and changes to regimen. Will continue to give prn doses.   Video chat with family set up. All questions answered to their satisfaction. Plan for family visitation at 62 AM tomorrow.

## 2018-11-30 DIAGNOSIS — U071 COVID-19: Principal | ICD-10-CM

## 2018-12-01 LAB — CULTURE, BLOOD (ROUTINE X 2)
Culture: NO GROWTH
Culture: NO GROWTH
Special Requests: ADEQUATE
Special Requests: ADEQUATE

## 2018-12-21 NOTE — Progress Notes (Addendum)
Asystole on monitor at 0542. Verified by this RN and Heide Scales, RN. Dr. Jamesetta Geralds notified. Granddaughter Kenia notified. [facility] contacted. Post mortem care complete.   50mg  of morphine wasted with Heide Scales, RN.
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
## 2018-12-21 NOTE — Progress Notes (Signed)
Patient HR 150s and O2 saturations in 60s. Family called and advised to visit patient. Patient transported to 9172 and family able to see patient.   Patient returned to 9144. HR in 160s and O2 in 50s. Will continue to provide comfort meds as needed.

## 2018-12-21 DEATH — deceased

## 2019-10-13 IMAGING — DX PORTABLE CHEST - 1 VIEW SAME DAY
1 series · 1 of 1 positions shown · non-contrast
Comparison: 11/26/2018

CLINICAL DATA: Shortness of breath

EXAM:
PORTABLE CHEST 1 VIEW

[chest]
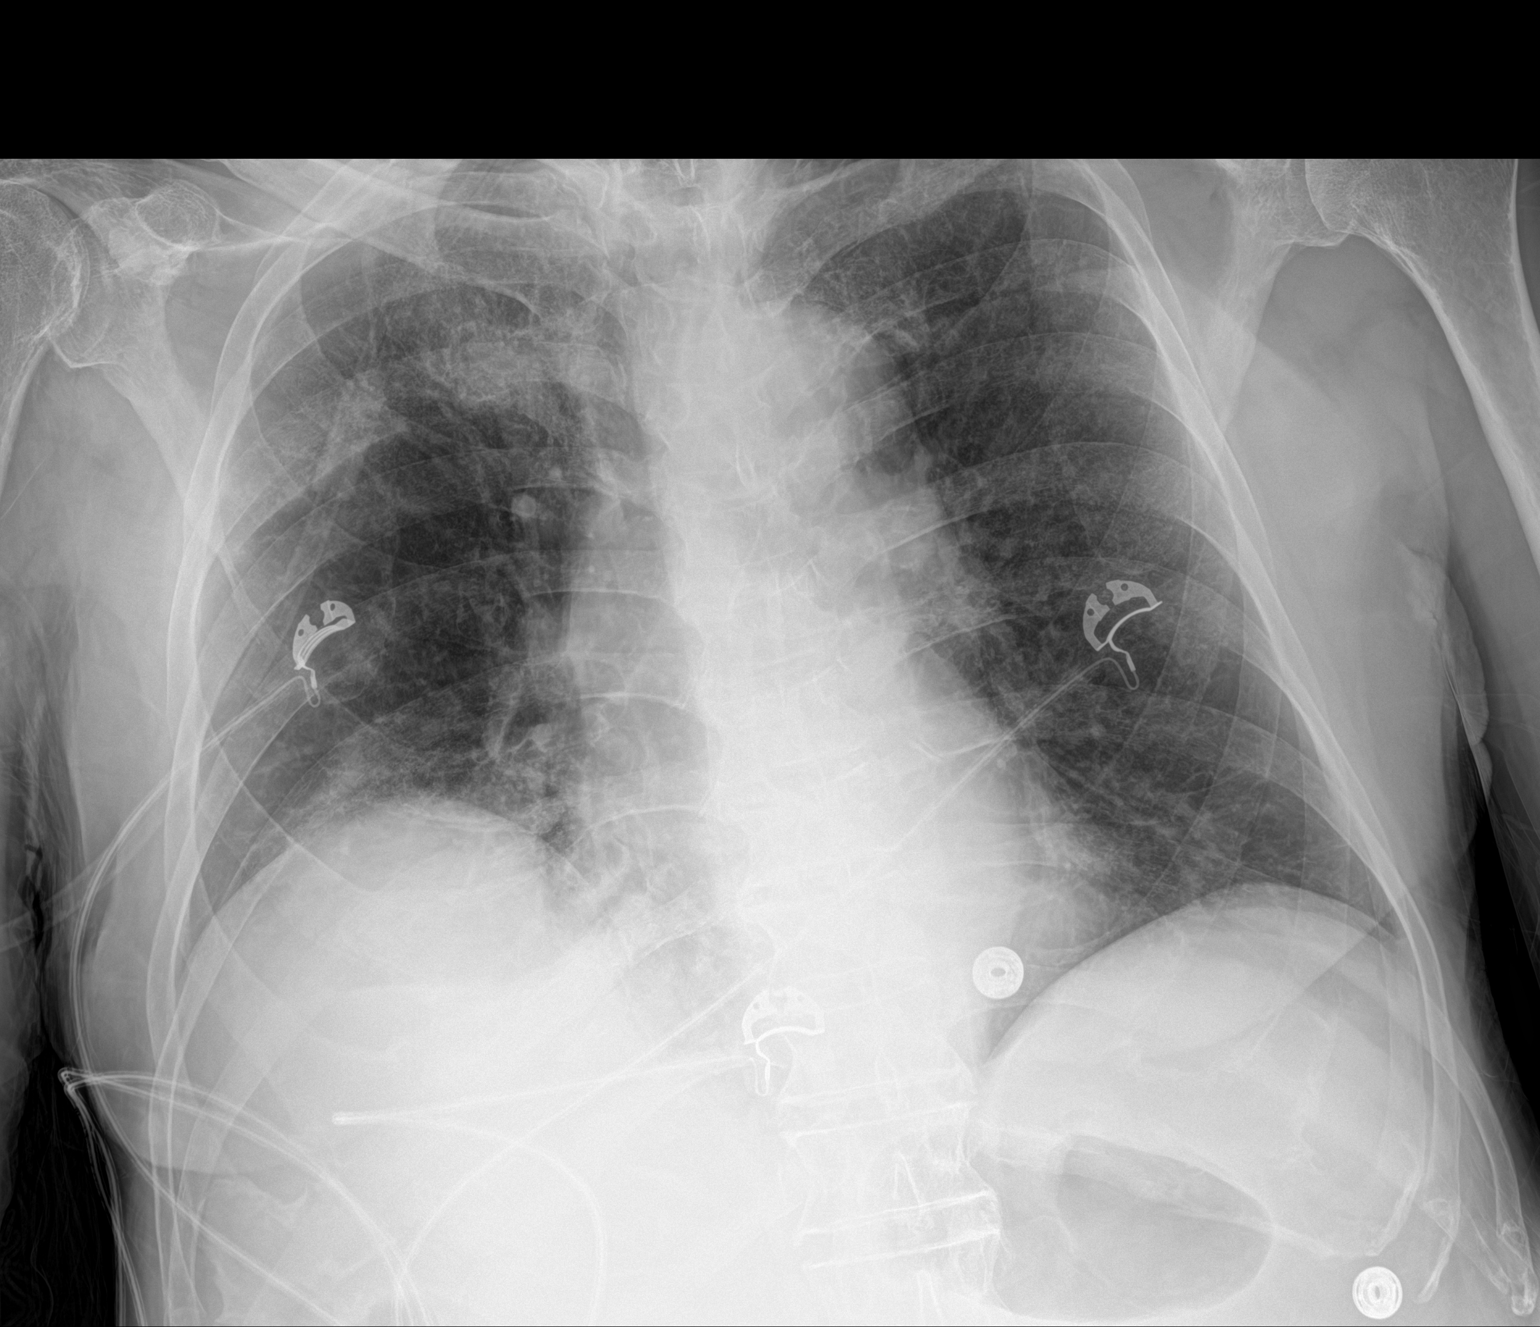

[1 of 1 positions shown; findings below may reference images not displayed]

FINDINGS: Mild elevation of the right hemidiaphragm. Increasing right basilar
atelectasis or infiltrate. Left lung clear. Heart is normal size.
IMPRESSION: Increasing right basilar airspace opacity which could reflect
atelectasis or pneumonia.
